# Patient Record
Sex: Male | Born: 1965 | Race: White | Hispanic: No | Marital: Married | Smoking: Former smoker
Health system: Southern US, Community
[De-identification: ages and names within clinical notes are randomized; demographics above are authoritative.]

## PROBLEM LIST (undated history)

## (undated) DIAGNOSIS — K5792 Diverticulitis of intestine, part unspecified, without perforation or abscess without bleeding: Secondary | ICD-10-CM

## (undated) DIAGNOSIS — M2550 Pain in unspecified joint: Secondary | ICD-10-CM

## (undated) DIAGNOSIS — I10 Essential (primary) hypertension: Secondary | ICD-10-CM

## (undated) DIAGNOSIS — R7303 Prediabetes: Secondary | ICD-10-CM

## (undated) DIAGNOSIS — F419 Anxiety disorder, unspecified: Secondary | ICD-10-CM

## (undated) HISTORY — DX: Essential (primary) hypertension: I10

## (undated) HISTORY — DX: Pain in unspecified joint: M25.50

## (undated) HISTORY — DX: Diverticulitis of intestine, part unspecified, without perforation or abscess without bleeding: K57.92

## (undated) HISTORY — DX: Anxiety disorder, unspecified: F41.9

## (undated) HISTORY — DX: Prediabetes: R73.03

---

## 1986-10-27 HISTORY — PX: NASAL SINUS SURGERY: SHX719

## 2004-12-30 ENCOUNTER — Ambulatory Visit: Payer: Self-pay | Admitting: Family Medicine

## 2014-03-09 ENCOUNTER — Telehealth: Payer: Self-pay

## 2014-03-09 NOTE — Telephone Encounter (Signed)
Patient confirmed

## 2014-03-10 ENCOUNTER — Ambulatory Visit (INDEPENDENT_AMBULATORY_CARE_PROVIDER_SITE_OTHER): Payer: BC Managed Care – PPO | Admitting: Internal Medicine

## 2014-03-10 ENCOUNTER — Encounter: Payer: Self-pay | Admitting: Internal Medicine

## 2014-03-10 VITALS — BP 132/82 | HR 67 | Temp 98.0°F | Ht 72.5 in | Wt 255.0 lb

## 2014-03-10 DIAGNOSIS — I10 Essential (primary) hypertension: Secondary | ICD-10-CM

## 2014-03-10 DIAGNOSIS — M2550 Pain in unspecified joint: Secondary | ICD-10-CM

## 2014-03-10 DIAGNOSIS — F411 Generalized anxiety disorder: Secondary | ICD-10-CM

## 2014-03-10 DIAGNOSIS — F419 Anxiety disorder, unspecified: Secondary | ICD-10-CM

## 2014-03-10 DIAGNOSIS — M1990 Unspecified osteoarthritis, unspecified site: Secondary | ICD-10-CM | POA: Insufficient documentation

## 2014-03-10 LAB — CBC WITH DIFFERENTIAL/PLATELET
Basophils Absolute: 0 10*3/uL (ref 0.0–0.1)
Basophils Relative: 0 % (ref 0–1)
EOS ABS: 0.1 10*3/uL (ref 0.0–0.7)
EOS PCT: 1 % (ref 0–5)
HEMATOCRIT: 43.6 % (ref 39.0–52.0)
HEMOGLOBIN: 16 g/dL (ref 13.0–17.0)
Lymphocytes Relative: 43 % (ref 12–46)
Lymphs Abs: 3.1 10*3/uL (ref 0.7–4.0)
MCH: 31.1 pg (ref 26.0–34.0)
MCHC: 36.7 g/dL — ABNORMAL HIGH (ref 30.0–36.0)
MCV: 84.7 fL (ref 78.0–100.0)
MONO ABS: 0.7 10*3/uL (ref 0.1–1.0)
MONOS PCT: 10 % (ref 3–12)
Neutro Abs: 3.4 10*3/uL (ref 1.7–7.7)
Neutrophils Relative %: 46 % (ref 43–77)
Platelets: 211 10*3/uL (ref 150–400)
RBC: 5.15 MIL/uL (ref 4.22–5.81)
RDW: 12.7 % (ref 11.5–15.5)
WBC: 7.3 10*3/uL (ref 4.0–10.5)

## 2014-03-10 LAB — COMPREHENSIVE METABOLIC PANEL
ALBUMIN: 4.6 g/dL (ref 3.5–5.2)
ALT: 15 U/L (ref 0–53)
AST: 18 U/L (ref 0–37)
Alkaline Phosphatase: 44 U/L (ref 39–117)
BILIRUBIN TOTAL: 0.5 mg/dL (ref 0.2–1.2)
BUN: 15 mg/dL (ref 6–23)
CO2: 25 mEq/L (ref 19–32)
CREATININE: 1.08 mg/dL (ref 0.50–1.35)
Calcium: 9.2 mg/dL (ref 8.4–10.5)
Chloride: 104 mEq/L (ref 96–112)
GLUCOSE: 97 mg/dL (ref 70–99)
Potassium: 4.4 mEq/L (ref 3.5–5.3)
Sodium: 140 mEq/L (ref 135–145)
Total Protein: 7.4 g/dL (ref 6.0–8.3)

## 2014-03-10 LAB — HEMOGLOBIN A1C
Hgb A1c MFr Bld: 6 % — ABNORMAL HIGH (ref <5.7)
MEAN PLASMA GLUCOSE: 126 mg/dL — AB (ref <117)

## 2014-03-10 MED ORDER — PAROXETINE HCL 20 MG PO TABS: 20.0000 mg | ORAL_TABLET | Freq: Every day | ORAL | Status: DC

## 2014-03-10 MED ORDER — LISINOPRIL 40 MG PO TABS: 40.0000 mg | ORAL_TABLET | Freq: Every day | ORAL | Status: DC

## 2014-03-10 NOTE — Assessment & Plan Note (Signed)
Long history of aches and pains, was taking relafen regularly, does not like to take the medication long-term. Recommend as needed Tylenol-motrin OTC Most important, I think he will benefit from a regular exercise and stretching program as well as lose weight. Offered a  physical therapy referral to learn appropriate stretching, he will let me know when he's ready

## 2014-03-10 NOTE — Progress Notes (Signed)
   Subjective:    Patient ID: Joel Hutchinson, male    DOB: Nov 24, 1965, 48 y.o.   MRN: [id number]  DOS:  03/10/2014 Type of  visit: New patient, to get  establish History of hypertension, good medication compliance, no ambulatory BPs however BPs are always wnl  whenever he goes to his physicians. History of panic attacks, on Paxil long-term, symptoms under excellent control History of arthralgias: Neck, back, elbows, shoulders. Has been on Relafen frequently, does not like to take it long term. Alternative meds?   ROS Denies chest pain, difficulty breathing. No nausea, vomiting, diarrhea. He  takes occasional walk but is otherwise not very active. Gained weight many years ago and has not been able to loose it (~55 pounds since he left high school).   Past Medical History  Diagnosis Date  . HTN (hypertension)   . Anxiety   . Arthralgia     Past Surgical History  Procedure Laterality Date  . Nasal sinus surgery  1988    History   Social History  . Marital Status: Married    Spouse Name: N/A    Number of Children: 0  . Years of Education: N/A   Occupational History  . truck driver    Social History Main Topics  . Smoking status: Former Research scientist (life sciences)  . Smokeless tobacco: Not on file     Comment: quit at age 58  . Alcohol Use: No  . Drug Use: No  . Sexual Activity: Not on file   Other Topics Concern  . Not on file   Social History Narrative  . No narrative on file     Family History  Problem Relation Age of Onset  . CAD Father     MI age 17, died few weeks later   . Diabetes Father     father, bro, GP  . Stroke Other     GM  . Atrial fibrillation Mother   . Colon cancer Neg Hx   . Prostate cancer Neg Hx        Medication List       This list is accurate as of: 03/10/14 11:59 PM.  Always use your most recent med list.               lisinopril 40 MG tablet  Commonly known as:  PRINIVIL,ZESTRIL  Take 1 tablet (40 mg total) by mouth daily.     PARoxetine 20 MG tablet  Commonly known as:  PAXIL  Take 1 tablet (20 mg total) by mouth daily.           Objective:   Physical Exam BP 132/82  Pulse 67  Temp(Src) 98 F (36.7 C)  Ht 6' 0.5" (1.842 m)  Wt 255 lb (115.667 kg)  BMI 34.09 kg/m2  SpO2 95%  General -- alert, well-developed, NAD.  Neck --no thyromegaly  HEENT-- Not pale.  Lungs -- normal respiratory effort, no intercostal retractions, no accessory muscle use, and normal breath sounds.  Heart-- normal rate, regular rhythm, no murmur.  Abdomen-- Not distended, good bowel sounds,soft, non-tender. Extremities-- no pretibial edema bilaterally ; hands -wrist-- no synovitis Neurologic--  alert & oriented X3. Speech normal, gait normal, strength normal in all extremities.  Psych-- Cognition and judgment appear intact. Cooperative with normal attention span and concentration. No anxious or depressed appearing.      Assessment & Plan:

## 2014-03-10 NOTE — Progress Notes (Signed)
Pre visit review using our clinic review tool, if applicable. No additional management support is needed unless otherwise documented below in the visit note. 

## 2014-03-10 NOTE — Patient Instructions (Signed)
Get your blood work before you leave   Next visit is for a physical exam in 6 months, fasting Please make an appointment     Get the records from your previous doctor

## 2014-03-10 NOTE — Assessment & Plan Note (Addendum)
Seems well controlled, continue with lisinopril, refill medications, labs including the A1c which the patient requested.

## 2014-03-10 NOTE — Assessment & Plan Note (Signed)
History of panic attacks, under excellent control for many years with Paxil. Refill Paxil today

## 2014-03-13 ENCOUNTER — Encounter: Payer: Self-pay | Admitting: *Deleted

## 2014-03-13 NOTE — Progress Notes (Signed)
Letter sent with labs results and MD notes

## 2014-03-14 ENCOUNTER — Telehealth: Payer: Self-pay | Admitting: *Deleted

## 2014-03-14 NOTE — Telephone Encounter (Signed)
Spoke with patient and advised of recent labs.  

## 2014-04-06 ENCOUNTER — Ambulatory Visit (INDEPENDENT_AMBULATORY_CARE_PROVIDER_SITE_OTHER): Payer: BC Managed Care – PPO | Admitting: Internal Medicine

## 2014-04-06 ENCOUNTER — Encounter: Payer: Self-pay | Admitting: Internal Medicine

## 2014-04-06 VITALS — BP 127/79 | HR 76 | Temp 98.0°F | Wt 242.0 lb

## 2014-04-06 DIAGNOSIS — R1013 Epigastric pain: Secondary | ICD-10-CM

## 2014-04-06 DIAGNOSIS — K3189 Other diseases of stomach and duodenum: Secondary | ICD-10-CM

## 2014-04-06 MED ORDER — OMEPRAZOLE 40 MG PO CPDR: 40.0000 mg | DELAYED_RELEASE_CAPSULE | Freq: Every day | ORAL | Status: DC

## 2014-04-06 NOTE — Progress Notes (Signed)
   Subjective:    Patient ID: Joel Hutchinson, male    DOB: 12-May-1966, 48 y.o.   MRN: [id number]  DOS:  04/06/2014 Type of  Visit: acute History: 6 days history of upper abdominal discomfort described as bloated, pressure; located @  the epigastric area as well as on the left and right upper abd. Described as dull and not burning. Taking Pepto-Bismol and tums but is not sure if that helped. Symptoms are usually 1 or 2 hours postprandial. No exertional symptoms. He takes Motrin 5 or 6 times a week, he is already holding that medication  ROS Denies fever or chills No chest pain, difficulty breathing or palpitations. No classic heartburn. No nausea, vomiting, diarrhea blood in the stools or change in the color of the stools.  Past Medical History  Diagnosis Date  . HTN (hypertension)   . Anxiety   . Arthralgia     Past Surgical History  Procedure Laterality Date  . Nasal sinus surgery  1988    History   Social History  . Marital Status: Married    Spouse Name: N/A    Number of Children: 0  . Years of Education: N/A   Occupational History  . truck driver    Social History Main Topics  . Smoking status: Former Research scientist (life sciences)  . Smokeless tobacco: Not on file     Comment: quit at age 63  . Alcohol Use: No  . Drug Use: No  . Sexual Activity: Not on file   Other Topics Concern  . Not on file   Social History Narrative  . No narrative on file        Medication List       This list is accurate as of: 04/06/14  2:43 PM.  Always use your most recent med list.               lisinopril 40 MG tablet  Commonly known as:  PRINIVIL,ZESTRIL  Take 1 tablet (40 mg total) by mouth daily.     omeprazole 40 MG capsule  Commonly known as:  PRILOSEC  Take 1 capsule (40 mg total) by mouth daily.     PARoxetine 20 MG tablet  Commonly known as:  PAXIL  Take 1 tablet (20 mg total) by mouth daily.           Objective:   Physical Exam BP 127/79  Pulse 76  Temp(Src) 98  F (36.7 C)  Wt 242 lb (109.77 kg)  SpO2 96% General -- alert, well-developed, NAD.   HEENT-- not pale  Lungs -- normal respiratory effort, no intercostal retractions, no accessory muscle use, and normal breath sounds.  Heart-- normal rate, regular rhythm, no murmur.  Abdomen-- Not distended, good bowel sounds,soft, Slightly tender at the epigastric area without mass or rebound..  No mass,organomegaly. Extremities-- no pretibial edema bilaterally  Neurologic--  alert & oriented X3. Speech normal, gait appropriate for age, strength symmetric and appropriate for age.  Psych-- Cognition and judgment appear intact. Cooperative with normal attention span and concentration. No anxious or depressed appearing.     Assessment & Plan:   Dyspepsia Upper abdominal discomfort for few days, no red flag symptoms. He recently started to take Motrin and has mild tenderness on  the epigastric area. No chest pain. Gastritis?. Plan: Omeprazole If he is not better or has more symptoms or different symptoms he will let me know. Consider a gallbladder ultrasound or further eval

## 2014-04-06 NOTE — Progress Notes (Signed)
Pre visit review using our clinic review tool, if applicable. No additional management support is needed unless otherwise documented below in the visit note. 

## 2014-04-06 NOTE — Patient Instructions (Signed)
Take omeprazole 1 tablet before breakfast for one month and then as needed Avoid Motrin for now  Call if you're not improving soon or  if you have increased symptoms, change in the color of the stools, chest pain.  Next visit for a checkup in November 2015

## 2014-04-09 ENCOUNTER — Observation Stay (HOSPITAL_COMMUNITY)
Admission: EM | Admit: 2014-04-09 | Discharge: 2014-04-10 | Disposition: A | Discharge status: Home / Self Care | Payer: BC Managed Care – PPO | Attending: General Surgery | Admitting: General Surgery

## 2014-04-09 ENCOUNTER — Observation Stay (HOSPITAL_COMMUNITY): Payer: BC Managed Care – PPO | Admitting: Anesthesiology

## 2014-04-09 ENCOUNTER — Encounter (HOSPITAL_COMMUNITY): Payer: BC Managed Care – PPO | Admitting: Anesthesiology

## 2014-04-09 ENCOUNTER — Emergency Department (HOSPITAL_COMMUNITY): Payer: BC Managed Care – PPO

## 2014-04-09 ENCOUNTER — Encounter (HOSPITAL_COMMUNITY): Payer: Self-pay | Admitting: Emergency Medicine

## 2014-04-09 ENCOUNTER — Encounter (HOSPITAL_COMMUNITY)
Admission: EM | Disposition: A | Discharge status: Home / Self Care | Payer: Self-pay | Source: Home / Self Care | Attending: Emergency Medicine

## 2014-04-09 DIAGNOSIS — R1011 Right upper quadrant pain: Secondary | ICD-10-CM

## 2014-04-09 DIAGNOSIS — F411 Generalized anxiety disorder: Secondary | ICD-10-CM | POA: Insufficient documentation

## 2014-04-09 DIAGNOSIS — K811 Chronic cholecystitis: Secondary | ICD-10-CM

## 2014-04-09 DIAGNOSIS — K8020 Calculus of gallbladder without cholecystitis without obstruction: Secondary | ICD-10-CM

## 2014-04-09 DIAGNOSIS — I10 Essential (primary) hypertension: Secondary | ICD-10-CM | POA: Insufficient documentation

## 2014-04-09 DIAGNOSIS — K810 Acute cholecystitis: Principal | ICD-10-CM | POA: Diagnosis present

## 2014-04-09 DIAGNOSIS — Z87891 Personal history of nicotine dependence: Secondary | ICD-10-CM | POA: Insufficient documentation

## 2014-04-09 DIAGNOSIS — M2550 Pain in unspecified joint: Secondary | ICD-10-CM | POA: Insufficient documentation

## 2014-04-09 HISTORY — PX: CHOLECYSTECTOMY: SHX55

## 2014-04-09 LAB — COMPREHENSIVE METABOLIC PANEL
ALT: 15 U/L (ref 0–53)
AST: 16 U/L (ref 0–37)
Albumin: 3.9 g/dL (ref 3.5–5.2)
Alkaline Phosphatase: 48 U/L (ref 39–117)
BILIRUBIN TOTAL: 0.3 mg/dL (ref 0.3–1.2)
BUN: 17 mg/dL (ref 6–23)
CO2: 26 meq/L (ref 19–32)
CREATININE: 1.01 mg/dL (ref 0.50–1.35)
Calcium: 9.3 mg/dL (ref 8.4–10.5)
Chloride: 98 mEq/L (ref 96–112)
GFR calc non Af Amer: 86 mL/min — ABNORMAL LOW (ref >90)
GLUCOSE: 165 mg/dL — AB (ref 70–99)
Potassium: 4.4 mEq/L (ref 3.7–5.3)
Sodium: 138 mEq/L (ref 137–147)
Total Protein: 7.6 g/dL (ref 6.0–8.3)

## 2014-04-09 LAB — CBC WITH DIFFERENTIAL/PLATELET
BASOS ABS: 0 10*3/uL (ref 0.0–0.1)
Basophils Relative: 0 % (ref 0–1)
EOS PCT: 0 % (ref 0–5)
Eosinophils Absolute: 0 10*3/uL (ref 0.0–0.7)
HEMATOCRIT: 40.4 % (ref 39.0–52.0)
HEMOGLOBIN: 14.2 g/dL (ref 13.0–17.0)
LYMPHS PCT: 10 % — AB (ref 12–46)
Lymphs Abs: 1.5 10*3/uL (ref 0.7–4.0)
MCH: 30.7 pg (ref 26.0–34.0)
MCHC: 35.1 g/dL (ref 30.0–36.0)
MCV: 87.3 fL (ref 78.0–100.0)
MONO ABS: 0.8 10*3/uL (ref 0.1–1.0)
MONOS PCT: 5 % (ref 3–12)
Neutro Abs: 12.5 10*3/uL — ABNORMAL HIGH (ref 1.7–7.7)
Neutrophils Relative %: 85 % — ABNORMAL HIGH (ref 43–77)
Platelets: 225 10*3/uL (ref 150–400)
RBC: 4.63 MIL/uL (ref 4.22–5.81)
RDW: 11.8 % (ref 11.5–15.5)
WBC: 14.8 10*3/uL — ABNORMAL HIGH (ref 4.0–10.5)

## 2014-04-09 LAB — CREATININE, SERUM
Creatinine, Ser: 0.87 mg/dL (ref 0.50–1.35)
GFR calc Af Amer: >90 mL/min (ref >90)
GFR calc non Af Amer: >90 mL/min (ref >90)

## 2014-04-09 LAB — CBC
HCT: 39.2 % (ref 39.0–52.0)
HEMOGLOBIN: 13.9 g/dL (ref 13.0–17.0)
MCH: 31 pg (ref 26.0–34.0)
MCHC: 35.5 g/dL (ref 30.0–36.0)
MCV: 87.5 fL (ref 78.0–100.0)
PLATELETS: 197 10*3/uL (ref 150–400)
RBC: 4.48 MIL/uL (ref 4.22–5.81)
RDW: 11.9 % (ref 11.5–15.5)
WBC: 12.2 10*3/uL — ABNORMAL HIGH (ref 4.0–10.5)

## 2014-04-09 LAB — LIPASE, BLOOD: Lipase: 51 U/L (ref 11–59)

## 2014-04-09 SURGERY — LAPAROSCOPIC CHOLECYSTECTOMY WITH INTRAOPERATIVE CHOLANGIOGRAM
Anesthesia: General

## 2014-04-09 MED ORDER — ROCURONIUM BROMIDE 100 MG/10ML IV SOLN
INTRAVENOUS | Status: DC | PRN
  Administered 2014-04-09: 10 mg via INTRAVENOUS
  Administered 2014-04-09: 5 mg via INTRAVENOUS
  Administered 2014-04-09: 50 mg via INTRAVENOUS

## 2014-04-09 MED ORDER — FENTANYL CITRATE 0.05 MG/ML IJ SOLN
INTRAMUSCULAR | Status: AC
  Filled 2014-04-09: qty 5

## 2014-04-09 MED ORDER — SODIUM CHLORIDE 0.9 % IV SOLN
Freq: Once | INTRAVENOUS | Status: AC
  Administered 2014-04-09: 06:00:00 via INTRAVENOUS

## 2014-04-09 MED ORDER — GLYCOPYRROLATE 0.2 MG/ML IJ SOLN
INTRAMUSCULAR | Status: DC | PRN
  Administered 2014-04-09: 0.4 mg via INTRAVENOUS
  Administered 2014-04-09: 0.2 mg via INTRAVENOUS

## 2014-04-09 MED ORDER — ACETAMINOPHEN 325 MG PO TABS
650.0000 mg | ORAL_TABLET | Freq: Four times a day (QID) | ORAL | Status: DC | PRN
  Administered 2014-04-09 (×2): 650 mg via ORAL
  Filled 2014-04-09 (×2): qty 2

## 2014-04-09 MED ORDER — KCL IN DEXTROSE-NACL 20-5-0.9 MEQ/L-%-% IV SOLN
INTRAVENOUS | Status: DC
  Administered 2014-04-09 (×2): via INTRAVENOUS
  Filled 2014-04-09 (×5): qty 1000

## 2014-04-09 MED ORDER — OXYCODONE HCL 5 MG PO TABS: 10.0000 mg | ORAL_TABLET | ORAL | Status: DC | PRN

## 2014-04-09 MED ORDER — NEOSTIGMINE METHYLSULFATE 10 MG/10ML IV SOLN
INTRAVENOUS | Status: AC
  Filled 2014-04-09: qty 1

## 2014-04-09 MED ORDER — ROCURONIUM BROMIDE 50 MG/5ML IV SOLN
INTRAVENOUS | Status: AC
  Filled 2014-04-09: qty 1

## 2014-04-09 MED ORDER — ONDANSETRON HCL 4 MG/2ML IJ SOLN: 4.0000 mg | Freq: Four times a day (QID) | INTRAMUSCULAR | Status: DC | PRN

## 2014-04-09 MED ORDER — MIDAZOLAM HCL 5 MG/5ML IJ SOLN
INTRAMUSCULAR | Status: DC | PRN
  Administered 2014-04-09: 2 mg via INTRAVENOUS

## 2014-04-09 MED ORDER — ATROPINE SULFATE 0.1 MG/ML IJ SOLN
INTRAMUSCULAR | Status: AC
  Filled 2014-04-09: qty 10

## 2014-04-09 MED ORDER — HYDROMORPHONE HCL PF 1 MG/ML IJ SOLN: 0.2500 mg | INTRAMUSCULAR | Status: DC | PRN

## 2014-04-09 MED ORDER — BUPIVACAINE-EPINEPHRINE 0.25% -1:200000 IJ SOLN
INTRAMUSCULAR | Status: DC | PRN
  Administered 2014-04-09: 10 mL

## 2014-04-09 MED ORDER — EPHEDRINE SULFATE 50 MG/ML IJ SOLN
INTRAMUSCULAR | Status: AC
  Filled 2014-04-09: qty 1

## 2014-04-09 MED ORDER — FENTANYL CITRATE 0.05 MG/ML IJ SOLN
INTRAMUSCULAR | Status: DC | PRN
  Administered 2014-04-09 (×3): 50 ug via INTRAVENOUS
  Administered 2014-04-09: 100 ug via INTRAVENOUS
  Administered 2014-04-09 (×2): 50 ug via INTRAVENOUS

## 2014-04-09 MED ORDER — SODIUM CHLORIDE 0.9 % IJ SOLN
INTRAMUSCULAR | Status: AC
  Filled 2014-04-09: qty 10

## 2014-04-09 MED ORDER — CIPROFLOXACIN IN D5W 400 MG/200ML IV SOLN
400.0000 mg | Freq: Two times a day (BID) | INTRAVENOUS | Status: DC
  Administered 2014-04-09 – 2014-04-10 (×3): 400 mg via INTRAVENOUS
  Filled 2014-04-09 (×4): qty 200

## 2014-04-09 MED ORDER — PHENYLEPHRINE 40 MCG/ML (10ML) SYRINGE FOR IV PUSH (FOR BLOOD PRESSURE SUPPORT)
PREFILLED_SYRINGE | INTRAVENOUS | Status: AC
  Filled 2014-04-09: qty 10

## 2014-04-09 MED ORDER — ONDANSETRON HCL 4 MG/2ML IJ SOLN: 4.0000 mg | Freq: Once | INTRAMUSCULAR | Status: DC | PRN

## 2014-04-09 MED ORDER — 0.9 % SODIUM CHLORIDE (POUR BTL) OPTIME
TOPICAL | Status: DC | PRN
  Administered 2014-04-09: 1000 mL

## 2014-04-09 MED ORDER — SUCCINYLCHOLINE CHLORIDE 20 MG/ML IJ SOLN
INTRAMUSCULAR | Status: AC
  Filled 2014-04-09: qty 1

## 2014-04-09 MED ORDER — ENOXAPARIN SODIUM 40 MG/0.4ML ~~LOC~~ SOLN
40.0000 mg | Freq: Every day | SUBCUTANEOUS | Status: DC
  Administered 2014-04-10: 40 mg via SUBCUTANEOUS
  Filled 2014-04-09: qty 0.4

## 2014-04-09 MED ORDER — PHENYLEPHRINE HCL 10 MG/ML IJ SOLN
INTRAMUSCULAR | Status: DC | PRN
  Administered 2014-04-09: 80 ug via INTRAVENOUS

## 2014-04-09 MED ORDER — HYDROMORPHONE HCL PF 1 MG/ML IJ SOLN: 1.0000 mg | INTRAMUSCULAR | Status: DC | PRN

## 2014-04-09 MED ORDER — PAROXETINE HCL 20 MG PO TABS
20.0000 mg | ORAL_TABLET | Freq: Every day | ORAL | Status: DC
  Administered 2014-04-09: 20 mg via ORAL
  Filled 2014-04-09 (×2): qty 1

## 2014-04-09 MED ORDER — LISINOPRIL 40 MG PO TABS
40.0000 mg | ORAL_TABLET | Freq: Every day | ORAL | Status: DC
  Administered 2014-04-09: 40 mg via ORAL
  Filled 2014-04-09 (×2): qty 1
  Filled 2014-04-09 (×2): qty 2

## 2014-04-09 MED ORDER — LIDOCAINE HCL (CARDIAC) 20 MG/ML IV SOLN
INTRAVENOUS | Status: AC
  Filled 2014-04-09: qty 5

## 2014-04-09 MED ORDER — OXYCODONE HCL 5 MG/5ML PO SOLN
5.0000 mg | Freq: Once | ORAL | Status: DC | PRN
Start: 2014-04-09 — End: 2014-04-09

## 2014-04-09 MED ORDER — SODIUM CHLORIDE 0.9 % IR SOLN
Status: DC | PRN
  Administered 2014-04-09: 1000 mL

## 2014-04-09 MED ORDER — ATROPINE SULFATE 0.4 MG/ML IJ SOLN
INTRAMUSCULAR | Status: DC | PRN
  Administered 2014-04-09: .2 mg via INTRAVENOUS

## 2014-04-09 MED ORDER — NEOSTIGMINE METHYLSULFATE 10 MG/10ML IV SOLN
INTRAVENOUS | Status: DC | PRN
  Administered 2014-04-09: 3 mg via INTRAVENOUS

## 2014-04-09 MED ORDER — LACTATED RINGERS IV SOLN
INTRAVENOUS | Status: DC | PRN
  Administered 2014-04-09 (×2): via INTRAVENOUS

## 2014-04-09 MED ORDER — DEXAMETHASONE SODIUM PHOSPHATE 4 MG/ML IJ SOLN
INTRAMUSCULAR | Status: AC
  Filled 2014-04-09: qty 2

## 2014-04-09 MED ORDER — PANTOPRAZOLE SODIUM 40 MG PO TBEC
40.0000 mg | DELAYED_RELEASE_TABLET | Freq: Every day | ORAL | Status: DC
  Administered 2014-04-09 – 2014-04-10 (×2): 40 mg via ORAL
  Filled 2014-04-09 (×2): qty 1

## 2014-04-09 MED ORDER — PROPOFOL 10 MG/ML IV BOLUS
INTRAVENOUS | Status: DC | PRN
  Administered 2014-04-09: 200 mg via INTRAVENOUS

## 2014-04-09 MED ORDER — LIDOCAINE HCL (CARDIAC) 20 MG/ML IV SOLN
INTRAVENOUS | Status: DC | PRN
  Administered 2014-04-09: 80 mg via INTRAVENOUS

## 2014-04-09 MED ORDER — MIDAZOLAM HCL 2 MG/2ML IJ SOLN
INTRAMUSCULAR | Status: AC
  Filled 2014-04-09: qty 2

## 2014-04-09 MED ORDER — MORPHINE SULFATE 4 MG/ML IJ SOLN
4.0000 mg | Freq: Once | INTRAMUSCULAR | Status: AC
  Administered 2014-04-09: 4 mg via INTRAVENOUS
  Filled 2014-04-09: qty 1

## 2014-04-09 MED ORDER — GLYCOPYRROLATE 0.2 MG/ML IJ SOLN
INTRAMUSCULAR | Status: AC
  Filled 2014-04-09: qty 2

## 2014-04-09 MED ORDER — OXYCODONE HCL 5 MG PO TABS: 5.0000 mg | ORAL_TABLET | Freq: Once | ORAL | Status: DC | PRN

## 2014-04-09 MED ORDER — DEXAMETHASONE SODIUM PHOSPHATE 4 MG/ML IJ SOLN
INTRAMUSCULAR | Status: DC | PRN
  Administered 2014-04-09: 10 mg via INTRAVENOUS

## 2014-04-09 MED ORDER — ONDANSETRON HCL 4 MG/2ML IJ SOLN
INTRAMUSCULAR | Status: AC
  Filled 2014-04-09: qty 2

## 2014-04-09 MED ORDER — ONDANSETRON HCL 4 MG/2ML IJ SOLN
4.0000 mg | Freq: Once | INTRAMUSCULAR | Status: AC
  Administered 2014-04-09: 4 mg via INTRAVENOUS
  Filled 2014-04-09: qty 2

## 2014-04-09 MED ORDER — SODIUM CHLORIDE 0.9 % IV SOLN
Freq: Once | INTRAVENOUS | Status: AC
Start: 2014-04-09 — End: 2014-04-09
  Administered 2014-04-09: 04:00:00 via INTRAVENOUS

## 2014-04-09 MED ORDER — SCOPOLAMINE 1 MG/3DAYS TD PT72
MEDICATED_PATCH | TRANSDERMAL | Status: AC
  Administered 2014-04-09: 1 via TRANSDERMAL
  Filled 2014-04-09: qty 1

## 2014-04-09 MED ORDER — BUPIVACAINE HCL (PF) 0.25 % IJ SOLN
INTRAMUSCULAR | Status: AC
  Filled 2014-04-09: qty 30

## 2014-04-09 MED ORDER — PROPOFOL 10 MG/ML IV BOLUS
INTRAVENOUS | Status: AC
  Filled 2014-04-09: qty 20

## 2014-04-09 MED ORDER — ONDANSETRON HCL 4 MG/2ML IJ SOLN
INTRAMUSCULAR | Status: DC | PRN
  Administered 2014-04-09: 4 mg via INTRAVENOUS

## 2014-04-09 SURGICAL SUPPLY — 38 items
ADH SKN CLS APL DERMABOND .7 (GAUZE/BANDAGES/DRESSINGS) ×1
APPLIER CLIP 5 13 M/L LIGAMAX5 (MISCELLANEOUS) ×3
APR CLP MED LRG 5 ANG JAW (MISCELLANEOUS) ×1
BAG SPEC RTRVL 10 TROC 200 (ENDOMECHANICALS) ×2
BLADE SURG ROTATE 9660 (MISCELLANEOUS) IMPLANT
CANISTER SUCTION 2500CC (MISCELLANEOUS) ×3 IMPLANT
CHLORAPREP W/TINT 26ML (MISCELLANEOUS) ×3 IMPLANT
CLIP APPLIE 5 13 M/L LIGAMAX5 (MISCELLANEOUS) ×1 IMPLANT
COVER MAYO STAND STRL (DRAPES) ×3 IMPLANT
COVER SURGICAL LIGHT HANDLE (MISCELLANEOUS) ×3 IMPLANT
DERMABOND ADVANCED (GAUZE/BANDAGES/DRESSINGS) ×2
DERMABOND ADVANCED .7 DNX12 (GAUZE/BANDAGES/DRESSINGS) ×1 IMPLANT
DRAPE C-ARM 42X72 X-RAY (DRAPES) ×3 IMPLANT
ELECT REM PT RETURN 9FT ADLT (ELECTROSURGICAL) ×3
ELECTRODE REM PT RTRN 9FT ADLT (ELECTROSURGICAL) ×1 IMPLANT
GLOVE BIO SURGEON STRL SZ7 (GLOVE) ×3 IMPLANT
GLOVE BIOGEL PI IND STRL 7.5 (GLOVE) ×1 IMPLANT
GLOVE BIOGEL PI INDICATOR 7.5 (GLOVE) ×2
GOWN STRL REUS W/ TWL LRG LVL3 (GOWN DISPOSABLE) ×4 IMPLANT
GOWN STRL REUS W/TWL LRG LVL3 (GOWN DISPOSABLE) ×12
HEMOSTAT SNOW SURGICEL 2X4 (HEMOSTASIS) ×2 IMPLANT
KIT BASIN OR (CUSTOM PROCEDURE TRAY) ×3 IMPLANT
KIT ROOM TURNOVER OR (KITS) ×3 IMPLANT
NS IRRIG 1000ML POUR BTL (IV SOLUTION) ×3 IMPLANT
PAD ARMBOARD 7.5X6 YLW CONV (MISCELLANEOUS) ×3 IMPLANT
POUCH RETRIEVAL ECOSAC 10 (ENDOMECHANICALS) ×1 IMPLANT
POUCH RETRIEVAL ECOSAC 10MM (ENDOMECHANICALS) ×4
SCISSORS LAP 5X35 DISP (ENDOMECHANICALS) ×3 IMPLANT
SET CHOLANGIOGRAPH 5 50 .035 (SET/KITS/TRAYS/PACK) ×3 IMPLANT
SET IRRIG TUBING LAPAROSCOPIC (IRRIGATION / IRRIGATOR) ×3 IMPLANT
SLEEVE ENDOPATH XCEL 5M (ENDOMECHANICALS) ×6 IMPLANT
SPECIMEN JAR SMALL (MISCELLANEOUS) ×3 IMPLANT
SUT MNCRL AB 4-0 PS2 18 (SUTURE) ×3 IMPLANT
TOWEL OR 17X24 6PK STRL BLUE (TOWEL DISPOSABLE) ×3 IMPLANT
TOWEL OR 17X26 10 PK STRL BLUE (TOWEL DISPOSABLE) ×3 IMPLANT
TRAY LAPAROSCOPIC (CUSTOM PROCEDURE TRAY) ×3 IMPLANT
TROCAR XCEL BLUNT TIP 100MML (ENDOMECHANICALS) ×3 IMPLANT
TROCAR XCEL NON-BLD 5MMX100MML (ENDOMECHANICALS) ×3 IMPLANT

## 2014-04-09 NOTE — Progress Notes (Signed)
Patient ID: Joel Hutchinson, male   DOB: 1966-10-17, 48 y.o.   MRN: [id number] I have seen patient, examined him, reviewed labs and studies. He has acute cholecystitis. I discussed the procedure in detail.    We discussed the risks and benefits of a laparoscopic cholecystectomy and possible cholangiogram including, but not limited to bleeding, infection, injury to surrounding structures such as the intestine or liver, bile leak, retained gallstones, need to convert to an open procedure, prolonged diarrhea, blood clots such as  DVT, common bile duct injury, anesthesia risks, and possible need for additional procedures.  The likelihood of improvement in symptoms and return to the patient's normal status is good. We discussed the typical post-operative recovery course.

## 2014-04-09 NOTE — Op Note (Addendum)
a 

## 2014-04-09 NOTE — ED Notes (Signed)
Attempted to call report x1, Dub Mikes RN will call back shortly

## 2014-04-09 NOTE — Anesthesia Preprocedure Evaluation (Addendum)
Anesthesia Evaluation  Patient identified by MRN, date of birth, ID band Patient awake    Reviewed: Allergy & Precautions, H&P , NPO status , Patient's Chart, lab work & pertinent test results  History of Anesthesia Complications (+) PONV  Airway Mallampati: I TM Distance: >3 FB Neck ROM: Full    Dental  (+) Teeth Intact, Dental Advisory Given   Pulmonary former smoker,  breath sounds clear to auscultation        Cardiovascular hypertension, Pt. on medications Rhythm:Regular Rate:Normal     Neuro/Psych    GI/Hepatic   Endo/Other    Renal/GU      Musculoskeletal   Abdominal   Peds  Hematology   Anesthesia Other Findings   Reproductive/Obstetrics                          Anesthesia Physical Anesthesia Plan  ASA: II  Anesthesia Plan: General   Post-op Pain Management:    Induction: Intravenous  Airway Management Planned: Oral ETT  Additional Equipment:   Intra-op Plan:   Post-operative Plan: Extubation in OR  Informed Consent: I have reviewed the patients History and Physical, chart, labs and discussed the procedure including the risks, benefits and alternatives for the proposed anesthesia with the patient or authorized representative who has indicated his/her understanding and acceptance.   Dental advisory given  Plan Discussed with: CRNA, Anesthesiologist and Surgeon  Anesthesia Plan Comments:         Anesthesia Quick Evaluation

## 2014-04-09 NOTE — Anesthesia Procedure Notes (Addendum)
Procedure Name: Intubation Date/Time: 04/09/2014 8:41 AM Performed by: Julian Reil Pre-anesthesia Checklist: Patient identified, Emergency Drugs available, Suction available and Patient being monitored Patient Re-evaluated:Patient Re-evaluated prior to inductionOxygen Delivery Method: Circle system utilized Preoxygenation: Pre-oxygenation with 100% oxygen Intubation Type: IV induction Ventilation: Mask ventilation without difficulty and Oral airway inserted - appropriate to patient size Laryngoscope Size: Mac and 4 Grade View: Grade I Tube type: Oral Tube size: 7.5 mm Number of attempts: 1 Airway Equipment and Method: Stylet Placement Confirmation: ETT inserted through vocal cords under direct vision,  positive ETCO2 and breath sounds checked- equal and bilateral Secured at: 21 cm Tube secured with: Tape Dental Injury: Teeth and Oropharynx as per pre-operative assessment

## 2014-04-09 NOTE — Anesthesia Postprocedure Evaluation (Signed)
  Anesthesia Post-op Note  Patient: Joel Hutchinson  Procedure(s) Performed: Procedure(s): LAPAROSCOPIC CHOLECYSTECTOMY (N/A)  Patient Location: PACU  Anesthesia Type:General  Level of Consciousness: awake, alert  and oriented  Airway and Oxygen Therapy: Patient Spontanous Breathing and Patient connected to nasal cannula oxygen  Post-op Pain: none  Post-op Assessment: Post-op Vital signs reviewed  Post-op Vital Signs: Reviewed  Last Vitals:  Filed Vitals:   04/09/14 1013  BP: 108/51  Pulse: 86  Temp: 36.8 C  Resp: 22    Complications: No apparent anesthesia complications

## 2014-04-09 NOTE — ED Provider Notes (Signed)
CSN: [id number]     Arrival date & time 04/09/14  0107 History   First MD Initiated Contact with Patient 04/09/14 0300     Chief Complaint  Patient presents with  . Abdominal Pain     (Consider location/radiation/quality/duration/timing/severity/associated sxs/prior Treatment) HPI 48 year old male presents to emergency room with complaint of abdominal pain, nausea.  Patient reports a week ago Friday he had to 3 hours of upper abdominal pain associated with bloating and nausea.  Symptoms occurred again Wednesday evening as well.  On Thursday, he was seen by his primary care Dr. Larose Kells.  He was started on Prilosec for gastritis.  Tonight had another episode of worsening sharp pain in his right upper quadrant with radiation across his abdomen.  No fevers or chills.  No diarrhea.  Patient recently diagnosed as prediabetes, and has been dieting.  Last nights meal was a break from the dieting. Past Medical History  Diagnosis Date  . HTN (hypertension)   . Anxiety   . Arthralgia    Past Surgical History  Procedure Laterality Date  . Nasal sinus surgery  1988   Family History  Problem Relation Age of Onset  . CAD Father     MI age 42, died few weeks later   . Diabetes Father     father, bro, GP  . Stroke Other     GM  . Atrial fibrillation Mother   . Colon cancer Neg Hx   . Prostate cancer Neg Hx    History  Substance Use Topics  . Smoking status: Former Research scientist (life sciences)  . Smokeless tobacco: Not on file     Comment: quit at age 58  . Alcohol Use: No    Review of Systems  See History of Present Illness; otherwise all other systems are reviewed and negative   Allergies  Review of patient's allergies indicates no known allergies.  Home Medications   Prior to Admission medications   Medication Sig Start Date End Date Taking? Authorizing Provider  acetaminophen (TYLENOL) 500 MG tablet Take 1,000 mg by mouth every 6 (six) hours as needed for mild pain.   Yes Historical Provider, MD   lisinopril (PRINIVIL,ZESTRIL) 40 MG tablet Take 1 tablet (40 mg total) by mouth daily. 03/10/14  Yes Colon Branch, MD  omeprazole (PRILOSEC) 40 MG capsule Take 1 capsule (40 mg total) by mouth daily. 04/06/14  Yes Colon Branch, MD  PARoxetine (PAXIL) 20 MG tablet Take 1 tablet (20 mg total) by mouth daily. 03/10/14  Yes Colon Branch, MD   BP 127/76  Pulse 47  Temp(Src) 98.3 F (36.8 C) (Oral)  Resp 12  Ht 6' (1.829 m)  Wt 242 lb (109.77 kg)  BMI 32.81 kg/m2  SpO2 99% Physical Exam  Nursing note and vitals reviewed. Constitutional: He is oriented to person, place, and time. He appears well-developed and well-nourished. He appears distressed (uncomfortable appearing).  HENT:  Head: Normocephalic and atraumatic.  Nose: Nose normal.  Mouth/Throat: Oropharynx is clear and moist.  Eyes: Conjunctivae and EOM are normal. Pupils are equal, round, and reactive to light.  Neck: Normal range of motion. Neck supple. No JVD present. No tracheal deviation present. No thyromegaly present.  Cardiovascular: Normal rate, regular rhythm, normal heart sounds and intact distal pulses.  Exam reveals no gallop and no friction rub.   No murmur heard. Pulmonary/Chest: Effort normal and breath sounds normal. No stridor. No respiratory distress. He has no wheezes. He has no rales. He exhibits no tenderness.  Abdominal: Soft. Bowel sounds are normal. He exhibits no distension and no mass. There is tenderness (patient has significant tenderness in right upper quadrant with positive Murphy sign.). There is no rebound and no guarding.  Musculoskeletal: Normal range of motion. He exhibits no edema and no tenderness.  Lymphadenopathy:    He has no cervical adenopathy.  Neurological: He is alert and oriented to person, place, and time. He exhibits normal muscle tone. Coordination normal.  Skin: Skin is warm and dry. No rash noted. No erythema. No pallor.  Psychiatric: He has a normal mood and affect. His behavior is normal.  Judgment and thought content normal.    ED Course  Procedures (including critical care time) Labs Review Labs Reviewed  CBC WITH DIFFERENTIAL - Abnormal; Notable for the following:    WBC 14.8 (*)    Neutrophils Relative % 85 (*)    Neutro Abs 12.5 (*)    Lymphocytes Relative 10 (*)    All other components within normal limits  COMPREHENSIVE METABOLIC PANEL - Abnormal; Notable for the following:    Glucose, Bld 165 (*)    GFR calc non Af Amer 86 (*)    All other components within normal limits  LIPASE, BLOOD    Imaging Review US Abdomen Limited  04/09/2014   CLINICAL DATA:  Right upper quadrant abdominal pain.  EXAM: US ABDOMEN LIMITED - RIGHT UPPER QUADRANT  COMPARISON:  None.  FINDINGS: Gallbladder:  Sludge and mobile stones are seen within the gallbladder, with stones measuring up to 0.6 cm in size. There is diffuse gallbladder wall thickening to 0.6 cm, with a positive ultrasonographic Murphy's sign, concerning for acute cholecystitis.  Common bile duct:  Diameter: 0.4 cm, within normal limits in caliber.  Liver:  No focal lesion identified. Mildly heterogeneous parenchymal echogenicity, without definite fatty infiltration.  IMPRESSION: Diffuse gallbladder wall thickening, with a positive ultrasonographic Murphy's sign, concerning for acute cholecystitis. Underlying sludge and mobile stones seen within the gallbladder. No evidence for obstruction.   Electronically Signed   By: Garald Balding M.D.   On: 04/09/2014 04:29     EKG Interpretation   Date/Time:  Sunday April 09 2014 02:06:01 EDT Ventricular Rate:  55 PR Interval:  134 QRS Duration: 152 QT Interval:  433 QTC Calculation: 414 R Axis:   84 Text Interpretation:  Sinus arrhythmia Right bundle branch block No old  tracing to compare Confirmed by Zaden Sako  MD, Aboubacar Matsuo ([id number]) on 04/09/2014  3:23:05 AM      MDM   Final diagnoses:  Acute cholecystitis    48 year old male with right upper quadrant pain, elevated white blood  cell count and symptoms that seem consistent with biliary colic.  Chemistries are pending, plan for right upper quadrant ultrasound.  Will give pain and nausea medicine.  Ultrasound shows cholelithiasis and possible cholecystitis with gallbladder wall thickening.  Case discussed with Dr. Brantley Stage, and they will consult in the ED  Kalman Drape, MD 04/09/14 [phone]

## 2014-04-09 NOTE — ED Notes (Addendum)
Pt arrives with c/o RUQ abd pain rated 8/10, was seen at primary care within the last week and was dxed with gastrinitis according to pt's wife. Pt has been using generic previcid, ineffective. Pain worse today, nauseated, denies V/D. Feels lightheaded.

## 2014-04-09 NOTE — Op Note (Signed)
Preoperative diagnosis: acute cholecystitis Postoperative diagnosis: same as above  Procedure: laparoscopic cholecystectomy  Surgeon: Dr Serita Grammes  Anesthesia: Gen.  Specimens: Gallbladder and contents to pathology  Drains: none  Estimated blood loss: Minimal  Complications none  Sponge count correct at completion  Disposition to recovery stable   Indications: This 26 yom admitted with acute cholecystitis.  We discussed laparoscopic cholecystectomy and risks/benefits associated with it.   Procedure: After informed consent was obtained the patient was taken to the operating room. He was given antibiotics preoperatively. Sequential compression devices were on his legs. He was placed under general anesthesia without complication. His abdomen was prepped and draped in the standard sterile surgical fashion. Surgical timeout was performed.  I infiltrated Marcaine below his umbilicus. I made a vertical incision. I incised his fascia and entered into the peritoneum bluntly. I placed a 0 Vicryl pursestring suture to the fascia. I inserted a Hassan trocar and insufflated the abdomen to 15 mm mercury pressure. I then inserted 3 further 5 mm trocars in the epigastrium and right upper quadrant under direct vision without complication. The gallbladder was retracted cephalad and lateral. The gallbladder was acutely inflamed with a very thick wall. I had to aspirate it to be able to grasp it. Eventually I was able to obtain the critical view of safety. I clipped the artery and divided it. I treated a posterior branch in a similar fashion. The duct was then clipped with the clips traversing the entire duct. The duct was divided. He had a very short cystic duct. The duct was viable there as well. . I then removed the gallbladder from the liver bed. I was able to place the gallbladder in a bag and remove it. I obtained hemostasis. I did enter the gallbladder and the liver while removing it due to the inflammation.   I placed a piece of surgicel snow in the bed. I then removed all my trocars. I tied down my umbilical stitch and completely obliterated the defect. I did place an additional 0 vicryl stitch in the umbilicus. Then I closed using 4-0 Monocryl and Dermabond. He tolerated this well was extubated and transferred to recovery stable.

## 2014-04-09 NOTE — Transfer of Care (Signed)
Immediate Anesthesia Transfer of Care Note  Patient: Joel Hutchinson  Procedure(s) Performed: Procedure(s): LAPAROSCOPIC CHOLECYSTECTOMY (N/A)  Patient Location: PACU  Anesthesia Type:General  Level of Consciousness: awake, alert , oriented and patient cooperative  Airway & Oxygen Therapy: Patient Spontanous Breathing and Patient connected to face mask oxygen  Post-op Assessment: Report given to PACU RN, Post -op Vital signs reviewed and stable and Patient moving all extremities  Post vital signs: Reviewed and stable  Complications: No apparent anesthesia complications

## 2014-04-09 NOTE — H&P (Signed)
Joel Hutchinson is an 48 y.o. male.   Chief Complaint: abdominal pain HPI: pt presents with hx of RUQ abdominal pain.  On and off since last Friday. Occurs at bed time and has gone away over a few hours.  Last night it occurred around 11 and did not go away.  Sharp, constant ,  Severe with radiation to back.  Made better with pain meds but not gone all the way.  U/S shows gallstones and thickened GB wall. Pt has positive sonographic Murphy sign.   Past Medical History  Diagnosis Date  . HTN (hypertension)   . Anxiety   . Arthralgia     Past Surgical History  Procedure Laterality Date  . Nasal sinus surgery  1988    Family History  Problem Relation Age of Onset  . CAD Father     MI age 52, died few weeks later   . Diabetes Father     father, bro, GP  . Stroke Other     GM  . Atrial fibrillation Mother   . Colon cancer Neg Hx   . Prostate cancer Neg Hx    Social History:  reports that he has quit smoking. He does not have any smokeless tobacco history on file. He reports that he does not drink alcohol or use illicit drugs.  Allergies: No Known Allergies   (Not in a hospital admission)  Results for orders placed during the hospital encounter of 04/09/14 (from the past 48 hour(s))  CBC WITH DIFFERENTIAL     Status: Abnormal   Collection Time    04/09/14  3:04 AM      Result Value Ref Range   WBC 14.8 (*) 4.0 - 10.5 K/uL   RBC 4.63  4.22 - 5.81 MIL/uL   Hemoglobin 14.2  13.0 - 17.0 g/dL   HCT 40.4  39.0 - 52.0 %   MCV 87.3  78.0 - 100.0 fL   MCH 30.7  26.0 - 34.0 pg   MCHC 35.1  30.0 - 36.0 g/dL   RDW 11.8  11.5 - 15.5 %   Platelets 225  150 - 400 K/uL   Neutrophils Relative % 85 (*) 43 - 77 %   Neutro Abs 12.5 (*) 1.7 - 7.7 K/uL   Lymphocytes Relative 10 (*) 12 - 46 %   Lymphs Abs 1.5  0.7 - 4.0 K/uL   Monocytes Relative 5  3 - 12 %   Monocytes Absolute 0.8  0.1 - 1.0 K/uL   Eosinophils Relative 0  0 - 5 %   Eosinophils Absolute 0.0  0.0 - 0.7 K/uL   Basophils  Relative 0  0 - 1 %   Basophils Absolute 0.0  0.0 - 0.1 K/uL  COMPREHENSIVE METABOLIC PANEL     Status: Abnormal   Collection Time    04/09/14  3:04 AM      Result Value Ref Range   Sodium 138  137 - 147 mEq/L   Potassium 4.4  3.7 - 5.3 mEq/L   Chloride 98  96 - 112 mEq/L   CO2 26  19 - 32 mEq/L   Glucose, Bld 165 (*) 70 - 99 mg/dL   BUN 17  6 - 23 mg/dL   Creatinine, Ser 1.01  0.50 - 1.35 mg/dL   Calcium 9.3  8.4 - 10.5 mg/dL   Total Protein 7.6  6.0 - 8.3 g/dL   Albumin 3.9  3.5 - 5.2 g/dL   AST 16  0 - 37  U/L   ALT 15  0 - 53 U/L   Alkaline Phosphatase 48  39 - 117 U/L   Total Bilirubin 0.3  0.3 - 1.2 mg/dL   GFR calc non Af Amer 86 (*) >90 mL/min   GFR calc Af Amer >90  >90 mL/min   Comment: (NOTE)     The eGFR has been calculated using the CKD EPI equation.     This calculation has not been validated in all clinical situations.     eGFR's persistently <90 mL/min signify possible Chronic Kidney     Disease.  LIPASE, BLOOD     Status: None   Collection Time    04/09/14  3:04 AM      Result Value Ref Range   Lipase 51  11 - 59 U/L   US Abdomen Limited  04/09/2014   CLINICAL DATA:  Right upper quadrant abdominal pain.  EXAM: US ABDOMEN LIMITED - RIGHT UPPER QUADRANT  COMPARISON:  None.  FINDINGS: Gallbladder:  Sludge and mobile stones are seen within the gallbladder, with stones measuring up to 0.6 cm in size. There is diffuse gallbladder wall thickening to 0.6 cm, with a positive ultrasonographic Murphy's sign, concerning for acute cholecystitis.  Common bile duct:  Diameter: 0.4 cm, within normal limits in caliber.  Liver:  No focal lesion identified. Mildly heterogeneous parenchymal echogenicity, without definite fatty infiltration.  IMPRESSION: Diffuse gallbladder wall thickening, with a positive ultrasonographic Murphy's sign, concerning for acute cholecystitis. Underlying sludge and mobile stones seen within the gallbladder. No evidence for obstruction.   Electronically  Signed   By: Garald Balding M.D.   On: 04/09/2014 04:29    Review of Systems  Constitutional: Positive for malaise/fatigue.  HENT: Negative.   Eyes: Negative.   Respiratory: Negative.   Gastrointestinal: Positive for abdominal pain. Negative for nausea and vomiting.  Genitourinary: Negative.   Musculoskeletal: Negative.   Skin: Negative.   Neurological: Positive for weakness.  Endo/Heme/Allergies: Negative.   Psychiatric/Behavioral: Negative.     Blood pressure 113/63, pulse 75, temperature 98.3 F (36.8 C), temperature source Oral, resp. rate 12, height 6' (1.829 m), weight 242 lb (109.77 kg), SpO2 92.00%. Physical Exam  Constitutional: He is oriented to person, place, and time. He appears well-developed and well-nourished.  HENT:  Head: Normocephalic and atraumatic.  Eyes: Pupils are equal, round, and reactive to light. No scleral icterus.  Neck: Normal range of motion. Neck supple.  Cardiovascular: Normal rate and regular rhythm.   Respiratory: Effort normal and breath sounds normal.  GI: There is tenderness in the right upper quadrant. There is positive Murphy's sign.  Musculoskeletal: Normal range of motion.  Neurological: He is alert and oriented to person, place, and time.  Skin: Skin is warm and dry.  Psychiatric: He has a normal mood and affect. His behavior is normal. Judgment and thought content normal.     Assessment/Plan Acute cholecystitis Admit IVF Antibiotics cipro 400 IV BID.  Will need laparoscopic cholecystectomy  Will try to set up for today.  Will discuss with Dr Donne Hazel.   Moon Budde A. 04/09/2014, 5:27 AM

## 2014-04-10 ENCOUNTER — Encounter: Payer: Self-pay | Admitting: General Surgery

## 2014-04-10 ENCOUNTER — Encounter (HOSPITAL_COMMUNITY): Payer: Self-pay | Admitting: General Surgery

## 2014-04-10 LAB — CBC
HEMATOCRIT: 38.8 % — AB (ref 39.0–52.0)
Hemoglobin: 13.2 g/dL (ref 13.0–17.0)
MCH: 30.3 pg (ref 26.0–34.0)
MCHC: 34 g/dL (ref 30.0–36.0)
MCV: 89.2 fL (ref 78.0–100.0)
Platelets: 224 10*3/uL (ref 150–400)
RBC: 4.35 MIL/uL (ref 4.22–5.81)
RDW: 12.4 % (ref 11.5–15.5)
WBC: 11.8 10*3/uL — ABNORMAL HIGH (ref 4.0–10.5)

## 2014-04-10 MED ORDER — CIPROFLOXACIN HCL 500 MG PO TABS: 500.0000 mg | ORAL_TABLET | Freq: Two times a day (BID) | ORAL | Status: DC

## 2014-04-10 MED ORDER — OXYCODONE HCL 5 MG PO TABS: 5.0000 mg | ORAL_TABLET | ORAL | Status: DC | PRN

## 2014-04-10 NOTE — Discharge Instructions (Signed)
Laparoscopic Cholecystectomy, Care After Refer to this sheet in the next few weeks. These instructions provide you with information on caring for yourself after your procedure. Your health care provider may also give you more specific instructions. Your treatment has been planned according to current medical practices, but problems sometimes occur. Call your health care provider if you have any problems or questions after your procedure. WHAT TO EXPECT AFTER THE PROCEDURE After your procedure, it is typical to have the following:  Pain at your incision sites. You will be given pain medicines to control the pain.  Mild nausea or vomiting. This should improve after the first 24 hours.  Bloating and possibly shoulder pain from the gas used during the procedure. This will improve after the first 24 hours. HOME CARE INSTRUCTIONS   Change bandages (dressings) as directed by your health care provider.  Keep the wound dry and clean. You may wash the wound gently with soap and water. Gently blot or dab the area dry.  Do not take baths or use swimming pools or hot tubs for 2 weeks or until your health care provider approves.  Only take over-the-counter or prescription medicines as directed by your health care provider.  Continue your normal diet as directed by your health care provider.  Do not lift anything heavier than 10 pounds (4.5 kg) until your health care provider approves.  Do not play contact sports for 1 week or until your health care provider approves. SEEK MEDICAL CARE IF:   You have redness, swelling, or increasing pain in the wound.  You notice yellowish-white fluid (pus) coming from the wound.  You have drainage from the wound that lasts longer than 1 day.  You notice a bad smell coming from the wound or dressing.  Your surgical cuts (incisions) break open. SEEK IMMEDIATE MEDICAL CARE IF:   You develop a rash.  You have difficulty breathing.  You have chest pain.  You  have a fever.  You have increasing pain in the shoulders (shoulder strap areas).  You have dizzy episodes or faint while standing.  You have severe abdominal pain.  You feel sick to your stomach (nauseous) or throw up (vomit) and this lasts for more than 1 day. Document Released: 10/13/2005 Document Revised: 08/03/2013 Document Reviewed: 05/25/2013 [facility] Patient Information 2014 [place].  CCS ______[facility] LAPAROSCOPIC SURGERY: POST OP INSTRUCTIONS Always review your discharge instruction sheet given to you by the facility where your surgery was performed. IF YOU HAVE DISABILITY OR FAMILY LEAVE FORMS, YOU MUST BRING THEM TO THE OFFICE FOR PROCESSING.   DO NOT GIVE THEM TO YOUR DOCTOR.  1. A prescription for pain medication may be given to you upon discharge.  Take your pain medication as prescribed, if needed.  If narcotic pain medicine is not needed, then you may take acetaminophen (Tylenol) or ibuprofen (Advil) as needed. 2. Take your usually prescribed medications unless otherwise directed. 3. If you need a refill on your pain medication, please contact your pharmacy.  They will contact our office to request authorization. Prescriptions will not be filled after 5pm or on week-ends. 4. You should follow a light diet the first few days after arrival home, such as soup and crackers, etc.  Be sure to include lots of fluids daily. 5. Most patients will experience some swelling and bruising in the area of the incisions.  Ice packs will help.  Swelling and bruising can take several days to resolve.  6. It is common  to experience some constipation if taking pain medication after surgery.  Increasing fluid intake and taking a stool softener (such as Colace) will usually help or prevent this problem from occurring.  A mild laxative (Milk of Magnesia or Miralax) should be taken according to package instructions if there are no bowel movements after 48  hours. 7. Unless discharge instructions indicate otherwise, you may remove your bandages 24-48 hours after surgery, and you may shower at that time.  You may have steri-strips (small skin tapes) in place directly over the incision.  These strips should be left on the skin for 7-10 days.  If your surgeon used skin glue on the incision, you may shower in 24 hours.  The glue will flake off over the next 2-3 weeks.  Any sutures or staples will be removed at the office during your follow-up visit. 8. ACTIVITIES:  You may resume regular (light) daily activities beginning the next day--such as daily self-care, walking, climbing stairs--gradually increasing activities as tolerated.  You may have sexual intercourse when it is comfortable.  Refrain from any heavy lifting or straining until approved by your doctor. a. You may drive when you are no longer taking prescription pain medication, you can comfortably wear a seatbelt, and you can safely maneuver your car and apply brakes. b. RETURN TO WORK:  __________________________________________________________ 9. You should see your doctor in the office for a follow-up appointment approximately 2-3 weeks after your surgery.  Make sure that you call for this appointment within a day or two after you arrive home to insure a convenient appointment time. 10. OTHER INSTRUCTIONS: __________________________________________________________________________________________________________________________ __________________________________________________________________________________________________________________________ WHEN TO CALL YOUR DOCTOR: 1. Fever over 101.0 2. Inability to urinate 3. Continued bleeding from incision. 4. Increased pain, redness, or drainage from the incision. 5. Increasing abdominal pain  The clinic staff is available to answer your questions during regular business hours.  Please dont hesitate to call and ask to speak to one of the nurses for  clinical concerns.  If you have a medical emergency, go to the nearest emergency room or call 911.  A surgeon from [facility] is always on call at the hospital. [address] [place], [place], [place]  [address] ? [address], [place], [place]   [address] [phone] ? [phone] ? FAX [phone] Web site: [email or web address]

## 2014-04-10 NOTE — Progress Notes (Signed)
1 Day Post-Op  Subjective: He has not had a BM, but ate breakfast and only taking tylenol for pain.  He is ready to go home.  Objective: Vital signs in last 24 hours: Temp:  [97.6 F (36.4 C)-98.4 F (36.9 C)] 97.6 F (36.4 C) (06/15 0622) Pulse Rate:  [63-81] 71 (06/15 0622) Resp:  [18-19] 18 (06/15 0622) BP: (116-131)/(61-65) 116/65 mmHg (06/15 0622) SpO2:  [92 %-94 %] 92 % (06/15 0622) Weight:  [111.948 kg (246 lb 12.8 oz)] 111.948 kg (246 lb 12.8 oz) (06/14 2013) Last BM Date: 04/08/14 Cardiac diet afebrile, VSS WBC is better but still slightly elevated  Intake/Output from previous day: 06/14 0701 - 06/15 0700 In: 1800 [I.V.:1800] Out: 1250 [Urine:1200; Blood:50] Intake/Output this shift:    General appearance: alert, cooperative and no distress GI: soft sore he has some sun burn from before surgery.  + BS, port sites look fine.  Lab Results:   Recent Labs  04/09/14 0535 04/10/14 0653  WBC 12.2* 11.8*  HGB 13.9 13.2  HCT 39.2 38.8*  PLT 197 224    BMET  Recent Labs  04/09/14 0304 04/09/14 0535  NA 138  --   K 4.4  --   CL 98  --   CO2 26  --   GLUCOSE 165*  --   BUN 17  --   CREATININE 1.01 0.87  CALCIUM 9.3  --    PT/INR No results found for this basename: LABPROT, INR,  in the last 72 hours   Recent Labs Lab 04/09/14 0304  AST 16  ALT 15  ALKPHOS 48  BILITOT 0.3  PROT 7.6  ALBUMIN 3.9     Lipase     Component Value Date/Time   LIPASE 51 04/09/2014 0304     Studies/Results: US Abdomen Limited  04/09/2014   CLINICAL DATA:  Right upper quadrant abdominal pain.  EXAM: US ABDOMEN LIMITED - RIGHT UPPER QUADRANT  COMPARISON:  None.  FINDINGS: Gallbladder:  Sludge and mobile stones are seen within the gallbladder, with stones measuring up to 0.6 cm in size. There is diffuse gallbladder wall thickening to 0.6 cm, with a positive ultrasonographic Murphy's sign, concerning for acute cholecystitis.  Common bile duct:  Diameter: 0.4 cm, within  normal limits in caliber.  Liver:  No focal lesion identified. Mildly heterogeneous parenchymal echogenicity, without definite fatty infiltration.  IMPRESSION: Diffuse gallbladder wall thickening, with a positive ultrasonographic Murphy's sign, concerning for acute cholecystitis. Underlying sludge and mobile stones seen within the gallbladder. No evidence for obstruction.   Electronically Signed   By: Garald Balding M.D.   On: 04/09/2014 04:29    Medications: . ciprofloxacin  400 mg Intravenous Q12H  . enoxaparin (LOVENOX) injection  40 mg Subcutaneous Daily  . lisinopril  40 mg Oral Daily  . pantoprazole  40 mg Oral Daily  . PARoxetine  20 mg Oral Daily    Assessment/Plan 1.  acute cholecystitis, laparoscopic cholecystectomy, 04/09/2014, Rolm Bookbinder, MD 2.  Hx of hypertension 3.  Anxiety 4.  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  I will get him set up for follow up in the office.  LOS: 1 day    Elverta Dimiceli 04/10/2014

## 2014-04-10 NOTE — Progress Notes (Signed)
Emmons Toth M. Yamira Papa, MD, FACS General, Bariatric, & Minimally Invasive Surgery [facility]  

## 2014-04-10 NOTE — Discharge Summary (Signed)
Physician Discharge Summary  Patient ID: Joel Hutchinson MRN: [id number] DOB/AGE: 49-27-1967 49 y.o.  Admit date: 04/09/2014 Discharge date: 04/10/2014  Admission Diagnoses: acute cholecystitis    Discharge Diagnoses:  1. acute cholecystitis 2. Hx of hypertension  3. Anxiety  4. Arthralgia 5.  Body mass index is 33.4  Active Problems:   Acute cholecystitis   PROCEDURES: Laparoscopic cholecystectomy, 04/09/2014, Rolm Bookbinder, MD     Hospital Course: pt presents with hx of RUQ abdominal pain. On and off since last Friday. Occurs at bed time and has gone away over a few hours. Last night it occurred around 11 and did not go away. Sharp, constant , Severe with radiation to back. Made better with pain meds but not gone all the way. U/S shows gallstones and thickened GB wall. Pt has positive sonographic Murphy sign.  He was admitted early Am by Dr. Brantley Stage.  He was seen later that AM and taken to the OR by Dr. Donne Hazel.  He had an acutely inflamed Gallbladder and the following Am his WBC was better but still up.  He took a regular diet and looked good.  We sent him home on 5 days of cipro.  Follow up in 3 weeks.  Port sites look fine and he was ready for discharge the following AM.   Condition on d/c:  Improved  Disposition: 01-Home or Self Care     Medication List         acetaminophen 500 MG tablet  Commonly known as:  TYLENOL  Take 1,000 mg by mouth every 6 (six) hours as needed for mild pain.     ciprofloxacin 500 MG tablet  Commonly known as:  CIPRO  Take 1 tablet (500 mg total) by mouth 2 (two) times daily.     lisinopril 40 MG tablet  Commonly known as:  PRINIVIL,ZESTRIL  Take 1 tablet (40 mg total) by mouth daily.     omeprazole 40 MG capsule  Commonly known as:  PRILOSEC  Take 1 capsule (40 mg total) by mouth daily.     oxyCODONE 5 MG immediate release tablet  Commonly known as:  ROXICODONE  Take 1-2 tablets (5-10 mg total) by mouth every 4 (four)  hours as needed for severe pain.     PARoxetine 20 MG tablet  Commonly known as:  PAXIL  Take 1 tablet (20 mg total) by mouth daily.           Follow-up Information   Call Kathlene November, MD. (As needed for medical issues.)    Specialty:  Internal Medicine   Contact information:   [phone] [address] [place] [address] [phone]       Follow up with [facility] On 05/02/2014. (Your appointment is at 3:00 PM, be there 30 minutes early for check in.)    Contact information:   [address]   [place] [address] [phone]       Signed: Earnstine Regal 04/10/2014, 2:53 PM

## 2014-04-10 NOTE — Discharge Summary (Signed)
Daniell Paradise M. Song Myre, MD, FACS General, Bariatric, & Minimally Invasive Surgery [facility]  

## 2014-04-10 NOTE — Progress Notes (Signed)
Discussed discharge summary with pt. Pt received Rx and work note. Pt did not have any questions. Pt ready and waiting for ride.

## 2014-04-11 ENCOUNTER — Telehealth (INDEPENDENT_AMBULATORY_CARE_PROVIDER_SITE_OTHER): Payer: Self-pay

## 2014-04-11 ENCOUNTER — Encounter (INDEPENDENT_AMBULATORY_CARE_PROVIDER_SITE_OTHER): Payer: Self-pay | Admitting: General Surgery

## 2014-04-11 NOTE — Telephone Encounter (Signed)
Pt states that he needs his work note changed and he needs his appt very early in the morning or at anytime on a Friday for his PO. I didn't see anything open. Please advise.

## 2014-04-11 NOTE — Telephone Encounter (Signed)
Called pt back and informed him of his new appt w/ Dr. Donne Hazel on 05/05/14 @ 1110.  He also informed me he needs his RTW note changed from 04/24/14 to 04/23/14 because his work week starts on Sunday.  Informed him that I will fax that letter to the number he provided [phone].

## 2014-04-24 ENCOUNTER — Ambulatory Visit: Payer: Self-pay | Admitting: Internal Medicine

## 2014-05-02 ENCOUNTER — Encounter (INDEPENDENT_AMBULATORY_CARE_PROVIDER_SITE_OTHER): Payer: BC Managed Care – PPO

## 2014-05-05 ENCOUNTER — Ambulatory Visit (INDEPENDENT_AMBULATORY_CARE_PROVIDER_SITE_OTHER): Payer: BC Managed Care – PPO | Admitting: General Surgery

## 2014-05-05 ENCOUNTER — Encounter (INDEPENDENT_AMBULATORY_CARE_PROVIDER_SITE_OTHER): Payer: Self-pay | Admitting: General Surgery

## 2014-05-05 VITALS — BP 126/78 | HR 74 | Temp 98.2°F | Ht 72.0 in | Wt 234.0 lb

## 2014-05-05 DIAGNOSIS — Z09 Encounter for follow-up examination after completed treatment for conditions other than malignant neoplasm: Secondary | ICD-10-CM

## 2014-05-05 NOTE — Progress Notes (Signed)
Subjective:     Patient ID: Joel Hutchinson, male   DOB: Mar 03, 1966, 48 y.o.   MRN: [id number]  HPI 48 yom s/p lap chole for acute cholecystitis.  Returns today doing well without complaints.  Eating well. bms normal. No n/v. No fevers  Review of Systems     Objective:   Physical Exam Healing incisions without infection, abd soft nontender    Assessment:     S/p lap chole     Plan:     He can return to full activity. I will see him back as needed.

## 2014-06-18 ENCOUNTER — Encounter: Payer: Self-pay | Admitting: Internal Medicine

## 2014-06-18 DIAGNOSIS — I4510 Unspecified right bundle-branch block: Secondary | ICD-10-CM | POA: Insufficient documentation

## 2014-06-27 DIAGNOSIS — K5792 Diverticulitis of intestine, part unspecified, without perforation or abscess without bleeding: Secondary | ICD-10-CM

## 2014-06-27 HISTORY — DX: Diverticulitis of intestine, part unspecified, without perforation or abscess without bleeding: K57.92

## 2014-07-24 ENCOUNTER — Encounter: Payer: Self-pay | Admitting: Internal Medicine

## 2014-07-24 ENCOUNTER — Ambulatory Visit (INDEPENDENT_AMBULATORY_CARE_PROVIDER_SITE_OTHER): Payer: BC Managed Care – PPO | Admitting: Internal Medicine

## 2014-07-24 VITALS — BP 124/72 | HR 78 | Temp 98.3°F | Wt 224.0 lb

## 2014-07-24 DIAGNOSIS — I4510 Unspecified right bundle-branch block: Secondary | ICD-10-CM

## 2014-07-24 DIAGNOSIS — R109 Unspecified abdominal pain: Secondary | ICD-10-CM

## 2014-07-24 DIAGNOSIS — R1032 Left lower quadrant pain: Secondary | ICD-10-CM | POA: Insufficient documentation

## 2014-07-24 LAB — POCT URINALYSIS DIPSTICK
Blood, UA: NEGATIVE
Glucose, UA: NEGATIVE
Ketones, UA: NEGATIVE
Leukocytes, UA: NEGATIVE
Nitrite, UA: NEGATIVE
PH UA: 5
PROTEIN UA: NEGATIVE
SPEC GRAV UA: 1.02
UROBILINOGEN UA: 0.2

## 2014-07-24 MED ORDER — CIPROFLOXACIN HCL 500 MG PO TABS
500.0000 mg | ORAL_TABLET | Freq: Two times a day (BID) | ORAL | Status: DC
Start: 2014-07-24 — End: 2014-07-27

## 2014-07-24 MED ORDER — METRONIDAZOLE 500 MG PO TABS: 500.0000 mg | ORAL_TABLET | Freq: Three times a day (TID) | ORAL | Status: DC

## 2014-07-24 NOTE — Progress Notes (Signed)
Pre visit review using our clinic review tool, if applicable. No additional management support is needed unless otherwise documented below in the visit note. 

## 2014-07-24 NOTE — Patient Instructions (Signed)
Bland diet Drink plenty of fluids Start taking antibiotics Get the CT done tomorrow ER if fever, chills, severe symptoms.

## 2014-07-24 NOTE — Progress Notes (Signed)
Subjective:    Patient ID: Joel Hutchinson, male    DOB: 1966-06-17, 48 y.o.   MRN: [id number]  DOS:  07/24/2014 Type of visit - description : acute  Interval history: Symptoms started about 3 days ago with mid  abdominal pain, shortly after the pain localized in the suprapubic area mostly on the left. He does have a history of backache, this time he is developing backache but is mostly on the left side and seems to be " connected" with the anterior abdominal pain. No worse with by mouth intake or moving his torso   ROS No fever chills, appetite is normal, and good tolerance with food. No nausea, vomiting, diarrhea or blood in the stools. BMs are irregular since he had his gallbladder out, last bowel movement was today. No dysuria, gross hematuria difficulty urinating  Past Medical History  Diagnosis Date  . HTN (hypertension)   . Anxiety   . Arthralgia     Past Surgical History  Procedure Laterality Date  . Nasal sinus surgery  1988  . Cholecystectomy N/A 04/09/2014    Procedure: LAPAROSCOPIC CHOLECYSTECTOMY;  Surgeon: Rolm Bookbinder, MD;  Location: [facility] OR;  Service: General;  Laterality: N/A;    History   Social History  . Marital Status: Married    Spouse Name: N/A    Number of Children: 0  . Years of Education: N/A   Occupational History  . truck driver    Social History Main Topics  . Smoking status: Former Research scientist (life sciences)  . Smokeless tobacco: Not on file     Comment: quit at age 48  . Alcohol Use: No  . Drug Use: No  . Sexual Activity: Not on file   Other Topics Concern  . Not on file   Social History Narrative  . No narrative on file        Medication List       This list is accurate as of: 07/24/14  5:50 PM.  Always use your most recent med list.               acetaminophen 500 MG tablet  Commonly known as:  TYLENOL  Take 1,000 mg by mouth every 6 (six) hours as needed for mild pain.     ciprofloxacin 500 MG tablet  Commonly known as:   CIPRO  Take 1 tablet (500 mg total) by mouth 2 (two) times daily.     lisinopril 40 MG tablet  Commonly known as:  PRINIVIL,ZESTRIL  Take 1 tablet (40 mg total) by mouth daily.     metroNIDAZOLE 500 MG tablet  Commonly known as:  FLAGYL  Take 1 tablet (500 mg total) by mouth 3 (three) times daily.     PARoxetine 20 MG tablet  Commonly known as:  PAXIL  Take 1 tablet (20 mg total) by mouth daily.           Objective:   Physical Exam  Abdominal:     BP 124/72  Pulse 78  Temp(Src) 98.3 F (36.8 C) (Oral)  Wt 224 lb (101.606 kg)  SpO2 96% General -- alert, well-developed, NAD.    HEENT-- Not pale or jaundice Lungs -- normal respiratory effort, no intercostal retractions, no accessory muscle use, and normal breath sounds.  Heart-- normal rate, regular rhythm, no murmur.  Abdomen-- Not distended, good bowel sounds,soft, + tender at the LLQ w/o rebound or rigidity. No mass,organomegaly. Question of L CVA tenderness   Extremities-- no pretibial edema bilaterally  Neurologic--  alert & oriented X3. Speech normal, gait appropriate for age, strength symmetric and appropriate for age.    Psych-- Cognition and judgment appear intact. Cooperative with normal attention span and concentration. No anxious or depressed appearing.        Assessment & Plan:   Left lower quadrant abdominal pain Presents with a three-day history of left lower quadrant abdominal pain,  Symptoms are quite suggestive of diverticulitis. No upper abdominal symptoms, no urinary symptoms, udip  negative. Plan: Start  Empiric Antibiotics CT of the abdomen, patient is unable to go today consequently we'll do first in the morning. UCX ER if symptoms severe. Further advice would results

## 2014-07-25 ENCOUNTER — Other Ambulatory Visit: Payer: Self-pay

## 2014-07-25 ENCOUNTER — Ambulatory Visit (HOSPITAL_BASED_OUTPATIENT_CLINIC_OR_DEPARTMENT_OTHER)
Admission: RE | Admit: 2014-07-25 | Discharge: 2014-07-25 | Disposition: A | Discharge status: Home / Self Care | Payer: BC Managed Care – PPO | Source: Ambulatory Visit | Attending: Internal Medicine | Admitting: Internal Medicine

## 2014-07-25 ENCOUNTER — Encounter (HOSPITAL_BASED_OUTPATIENT_CLINIC_OR_DEPARTMENT_OTHER): Payer: Self-pay

## 2014-07-25 DIAGNOSIS — M5137 Other intervertebral disc degeneration, lumbosacral region: Secondary | ICD-10-CM | POA: Insufficient documentation

## 2014-07-25 DIAGNOSIS — M47817 Spondylosis without myelopathy or radiculopathy, lumbosacral region: Secondary | ICD-10-CM | POA: Insufficient documentation

## 2014-07-25 DIAGNOSIS — K5732 Diverticulitis of large intestine without perforation or abscess without bleeding: Secondary | ICD-10-CM | POA: Diagnosis not present

## 2014-07-25 DIAGNOSIS — R109 Unspecified abdominal pain: Secondary | ICD-10-CM

## 2014-07-25 DIAGNOSIS — M51379 Other intervertebral disc degeneration, lumbosacral region without mention of lumbar back pain or lower extremity pain: Secondary | ICD-10-CM | POA: Insufficient documentation

## 2014-07-25 LAB — URINALYSIS, ROUTINE W REFLEX MICROSCOPIC
BILIRUBIN URINE: NEGATIVE
HGB URINE DIPSTICK: NEGATIVE
KETONES UR: NEGATIVE
Leukocytes, UA: NEGATIVE
Nitrite: NEGATIVE
PH: 5.5 (ref 5.0–8.0)
RBC / HPF: NONE SEEN (ref 0–?)
Specific Gravity, Urine: 1.025 (ref 1.000–1.030)
TOTAL PROTEIN, URINE-UPE24: NEGATIVE
URINE GLUCOSE: NEGATIVE
Urobilinogen, UA: 0.2 (ref 0.0–1.0)
WBC, UA: NONE SEEN (ref 0–?)

## 2014-07-25 MED ORDER — IOHEXOL 300 MG/ML  SOLN
100.0000 mL | Freq: Once | INTRAMUSCULAR | Status: AC | PRN
  Administered 2014-07-25: 100 mL via INTRAVENOUS

## 2014-07-26 LAB — URINE CULTURE
Colony Count: NO GROWTH
ORGANISM ID, BACTERIA: NO GROWTH

## 2014-07-27 ENCOUNTER — Telehealth: Payer: Self-pay | Admitting: Internal Medicine

## 2014-07-27 MED ORDER — AMOXICILLIN-POT CLAVULANATE 875-125 MG PO TABS: 1.0000 | ORAL_TABLET | Freq: Two times a day (BID) | ORAL | Status: DC

## 2014-07-27 NOTE — Telephone Encounter (Signed)
Please advise 

## 2014-07-27 NOTE — Telephone Encounter (Signed)
Spoke with the patient, diarrhea since yesterday, no blood in the stools. Pain is better. Diarrhea could be related to antibiotics. Plan: Switch to augmentin, prescription sent. Knows to call me if the diarrhea doesn't stop, if he has fever, chills or blood in the stools.

## 2014-07-27 NOTE — Telephone Encounter (Signed)
Caller name: Gerald Stabs  Call back [phone]   Reason for call:  Having diarrhea from antibiotics and would like to speak with Dr. Larose Kells cma

## 2014-09-15 ENCOUNTER — Ambulatory Visit (INDEPENDENT_AMBULATORY_CARE_PROVIDER_SITE_OTHER): Payer: 59 | Admitting: Internal Medicine

## 2014-09-15 ENCOUNTER — Encounter: Payer: Self-pay | Admitting: Internal Medicine

## 2014-09-15 VITALS — BP 123/81 | HR 58 | Temp 97.8°F | Wt 228.0 lb

## 2014-09-15 DIAGNOSIS — Z23 Encounter for immunization: Secondary | ICD-10-CM

## 2014-09-15 DIAGNOSIS — Z0000 Encounter for general adult medical examination without abnormal findings: Secondary | ICD-10-CM

## 2014-09-15 DIAGNOSIS — R7309 Other abnormal glucose: Secondary | ICD-10-CM

## 2014-09-15 DIAGNOSIS — R7303 Prediabetes: Secondary | ICD-10-CM

## 2014-09-15 HISTORY — DX: Prediabetes: R73.03

## 2014-09-15 LAB — LIPID PANEL
CHOLESTEROL: 162 mg/dL (ref 0–200)
HDL: 36.1 mg/dL — ABNORMAL LOW (ref >39.00)
LDL CALC: 110 mg/dL — AB (ref 0–99)
NONHDL: 125.9
Total CHOL/HDL Ratio: 4
Triglycerides: 78 mg/dL (ref 0.0–149.0)
VLDL: 15.6 mg/dL (ref 0.0–40.0)

## 2014-09-15 LAB — BASIC METABOLIC PANEL
BUN: 16 mg/dL (ref 6–23)
CHLORIDE: 103 meq/L (ref 96–112)
CO2: 31 mEq/L (ref 19–32)
Calcium: 9.1 mg/dL (ref 8.4–10.5)
Creatinine, Ser: 0.9 mg/dL (ref 0.4–1.5)
GFR: 94.07 mL/min (ref >60.00)
Glucose, Bld: 109 mg/dL — ABNORMAL HIGH (ref 70–99)
POTASSIUM: 4.5 meq/L (ref 3.5–5.1)
SODIUM: 138 meq/L (ref 135–145)

## 2014-09-15 LAB — CBC WITH DIFFERENTIAL/PLATELET
Basophils Absolute: 0 10*3/uL (ref 0.0–0.1)
Basophils Relative: 0.6 % (ref 0.0–3.0)
EOS PCT: 0.7 % (ref 0.0–5.0)
Eosinophils Absolute: 0 10*3/uL (ref 0.0–0.7)
HEMATOCRIT: 46.4 % (ref 39.0–52.0)
Hemoglobin: 15.7 g/dL (ref 13.0–17.0)
LYMPHS ABS: 2.4 10*3/uL (ref 0.7–4.0)
Lymphocytes Relative: 43.5 % (ref 12.0–46.0)
MCHC: 33.8 g/dL (ref 30.0–36.0)
MCV: 90.7 fl (ref 78.0–100.0)
MONO ABS: 0.5 10*3/uL (ref 0.1–1.0)
Monocytes Relative: 8.4 % (ref 3.0–12.0)
Neutro Abs: 2.6 10*3/uL (ref 1.4–7.7)
Neutrophils Relative %: 46.8 % (ref 43.0–77.0)
Platelets: 178 10*3/uL (ref 150.0–400.0)
RBC: 5.11 Mil/uL (ref 4.22–5.81)
RDW: 13 % (ref 11.5–15.5)
WBC: 5.5 10*3/uL (ref 4.0–10.5)

## 2014-09-15 LAB — HEMOGLOBIN A1C: Hgb A1c MFr Bld: 5.9 % (ref 4.6–6.5)

## 2014-09-15 LAB — TSH: TSH: 1.47 u[IU]/mL (ref 0.35–4.50)

## 2014-09-15 MED ORDER — LISINOPRIL 40 MG PO TABS: 40.0000 mg | ORAL_TABLET | Freq: Every day | ORAL | Status: DC

## 2014-09-15 MED ORDER — PAROXETINE HCL 20 MG PO TABS: 20.0000 mg | ORAL_TABLET | Freq: Every day | ORAL | Status: DC

## 2014-09-15 NOTE — Patient Instructions (Signed)
Get your blood work before you leave    Please come back to the office in 6 months for a routine check up     Depending on your labs,  you may need to come back  fasting Schedule the visit at the front desk

## 2014-09-15 NOTE — Progress Notes (Signed)
Pre visit review using our clinic review tool, if applicable. No additional management support is needed unless otherwise documented below in the visit note. 

## 2014-09-15 NOTE — Assessment & Plan Note (Addendum)
Td > 10 years--- today Had a flu shot  Diet -exercise discussed Labs  Other issues  Prediabetes, has been doing better with diet and exercise, recheck A1c Hypertension well controlled, ambulatory BPs are checked frequently and normal FH CAD, father , age 49, he had DM -- plan is control CV RF S/p single episode of diverticulitis-- high fiber diet discussed, for cscope next year

## 2014-09-15 NOTE — Progress Notes (Signed)
   Subjective:    Patient ID: Joel Hutchinson, male    DOB: November 20, 1965, 48 y.o.   MRN: [id number]  DOS:  09/15/2014 Type of visit - description : CPX Interval history: doing well    ROS No  CP, SOB Denies  nausea, vomiting diarrhea, blood in the stools (-) cough, sputum production (-) wheezing, chest congestion No dysuria, gross hematuria, difficulty urinating  No anxiety, depression --- on paxil, sx controlled  Denies suicidal ideas     Past Medical History  Diagnosis Date  . HTN (hypertension)   . Anxiety   . Arthralgia   . Diverticulitis 06-2014  . Prediabetes 09/15/2014    Past Surgical History  Procedure Laterality Date  . Nasal sinus surgery  1988  . Cholecystectomy N/A 04/09/2014    Procedure: LAPAROSCOPIC CHOLECYSTECTOMY;  Surgeon: Rolm Bookbinder, MD;  Location: [facility] OR;  Service: General;  Laterality: N/A;    History   Social History  . Marital Status: Married    Spouse Name: N/A    Number of Children: 0  . Years of Education: N/A   Occupational History  . truck driver    Social History Main Topics  . Smoking status: Former Research scientist (life sciences)  . Smokeless tobacco: Not on file     Comment: quit at age 50  . Alcohol Use: No  . Drug Use: No  . Sexual Activity: Not on file   Other Topics Concern  . Not on file   Social History Narrative   Household-- pt and wife     Family History  Problem Relation Age of Onset  . CAD Father     MI age 86, died few weeks later   . Diabetes Father     father, bro, GP  . Stroke Other     GM  . Atrial fibrillation Mother   . Colon cancer Neg Hx   . Prostate cancer Neg Hx        Medication List       This list is accurate as of: 09/15/14 11:59 PM.  Always use your most recent med list.               acetaminophen 500 MG tablet  Commonly known as:  TYLENOL  Take 1,000 mg by mouth every 6 (six) hours as needed for mild pain.     lisinopril 40 MG tablet  Commonly known as:  PRINIVIL,ZESTRIL  Take 1 tablet  (40 mg total) by mouth daily.     PARoxetine 20 MG tablet  Commonly known as:  PAXIL  Take 1 tablet (20 mg total) by mouth daily.           Objective:   Physical Exam BP 123/81 mmHg  Pulse 58  Temp(Src) 97.8 F (36.6 C) (Oral)  Wt 228 lb (103.42 kg)  SpO2 93%  General -- alert, well-developed, NAD.  Neck --no thyromegaly  HEENT-- Not pale.  Lungs -- normal respiratory effort, no intercostal retractions, no accessory muscle use, and normal breath sounds.  Heart-- normal rate, regular rhythm, no murmur.  Abdomen-- Not distended, good bowel sounds,soft, non-tender. Extremities-- no pretibial edema bilaterally  Neurologic--  alert & oriented X3. Speech normal, gait appropriate for age, strength symmetric and appropriate for age.  Psych-- Cognition and judgment appear intact. Cooperative with normal attention span and concentration. No anxious or depressed appearing.       Assessment & Plan:

## 2015-03-16 ENCOUNTER — Encounter: Payer: Self-pay | Admitting: Internal Medicine

## 2015-03-16 ENCOUNTER — Ambulatory Visit (INDEPENDENT_AMBULATORY_CARE_PROVIDER_SITE_OTHER): Payer: 59 | Admitting: Internal Medicine

## 2015-03-16 VITALS — BP 128/64 | HR 57 | Temp 98.2°F | Ht 72.0 in | Wt 238.4 lb

## 2015-03-16 DIAGNOSIS — R7309 Other abnormal glucose: Secondary | ICD-10-CM | POA: Diagnosis not present

## 2015-03-16 DIAGNOSIS — F419 Anxiety disorder, unspecified: Secondary | ICD-10-CM | POA: Diagnosis not present

## 2015-03-16 DIAGNOSIS — R7303 Prediabetes: Secondary | ICD-10-CM

## 2015-03-16 DIAGNOSIS — I10 Essential (primary) hypertension: Secondary | ICD-10-CM | POA: Diagnosis not present

## 2015-03-16 LAB — BASIC METABOLIC PANEL
BUN: 11 mg/dL (ref 6–23)
CHLORIDE: 104 meq/L (ref 96–112)
CO2: 32 meq/L (ref 19–32)
Calcium: 9.3 mg/dL (ref 8.4–10.5)
Creatinine, Ser: 0.89 mg/dL (ref 0.40–1.50)
GFR: 96.32 mL/min (ref >60.00)
Glucose, Bld: 116 mg/dL — ABNORMAL HIGH (ref 70–99)
Potassium: 4.6 mEq/L (ref 3.5–5.1)
Sodium: 139 mEq/L (ref 135–145)

## 2015-03-16 LAB — HEMOGLOBIN A1C: HEMOGLOBIN A1C: 5.5 % (ref 4.6–6.5)

## 2015-03-16 MED ORDER — LISINOPRIL 40 MG PO TABS: 40.0000 mg | ORAL_TABLET | Freq: Every day | ORAL | Status: DC

## 2015-03-16 MED ORDER — PAROXETINE HCL 20 MG PO TABS: 20.0000 mg | ORAL_TABLET | Freq: Every day | ORAL | Status: DC

## 2015-03-16 NOTE — Progress Notes (Signed)
   Subjective:    Patient ID: Joel Hutchinson, male    DOB: 1966-08-05, 49 y.o.   MRN: [id number]  DOS:  03/16/2015 Type of visit - description : f/u Interval history: Hypertension, good compliance of medication Prediabetes, since the last time he was here he has gained weight, mostly due to the lack of exercise and not eating healthier. He changed jobs and his routine was changed. No ambulatory BPs   Review of Systems Denies chest pain, difficulty breathing, palpitations. No nausea or vomiting  Past Medical History  Diagnosis Date  . HTN (hypertension)   . Anxiety   . Arthralgia   . Diverticulitis 06-2014  . Prediabetes 09/15/2014    Past Surgical History  Procedure Laterality Date  . Nasal sinus surgery  1988  . Cholecystectomy N/A 04/09/2014    Procedure: LAPAROSCOPIC CHOLECYSTECTOMY;  Surgeon: Rolm Bookbinder, MD;  Location: [facility] OR;  Service: General;  Laterality: N/A;    History   Social History  . Marital Status: Married    Spouse Name: N/A  . Number of Children: 0  . Years of Education: N/A   Occupational History  . truck driver    Social History Main Topics  . Smoking status: Former Research scientist (life sciences)  . Smokeless tobacco: Not on file     Comment: quit at age 48  . Alcohol Use: No  . Drug Use: No  . Sexual Activity: Not on file   Other Topics Concern  . Not on file   Social History Narrative   Household-- pt and wife        Medication List       This list is accurate as of: 03/16/15 11:59 PM.  Always use your most recent med list.               acetaminophen 500 MG tablet  Commonly known as:  TYLENOL  Take 1,000 mg by mouth every 6 (six) hours as needed for mild pain.     lisinopril 40 MG tablet  Commonly known as:  PRINIVIL,ZESTRIL  Take 1 tablet (40 mg total) by mouth daily.     PARoxetine 20 MG tablet  Commonly known as:  PAXIL  Take 1 tablet (20 mg total) by mouth daily.           Objective:   Physical Exam BP 128/64 mmHg  Pulse  57  Temp(Src) 98.2 F (36.8 C) (Oral)  Ht 6' (1.829 m)  Wt 238 lb 6 oz (108.126 kg)  BMI 32.32 kg/m2  SpO2 97%  General:   Well developed, well nourished . NAD.  HEENT:  Normocephalic . Face symmetric, atraumatic Lungs:  CTA B Normal respiratory effort, no intercostal retractions, no accessory muscle use. Heart: RRR,  no murmur.  No pretibial edema bilaterally  Skin: Not pale. Not jaundice Neurologic:  alert & oriented X3.  Speech normal, gait appropriate for age and unassisted Psych--  Cognition and judgment appear intact.  Cooperative with normal attention span and concentration.  Behavior appropriate. No anxious or depressed appearing.       Assessment & Plan:

## 2015-03-16 NOTE — Progress Notes (Signed)
Pre visit review using our clinic review tool, if applicable. No additional management support is needed unless otherwise documented below in the visit note. 

## 2015-03-16 NOTE — Assessment & Plan Note (Signed)
History of anxiety in crowded places, symptoms currently well controlled on Paxil

## 2015-03-16 NOTE — Patient Instructions (Signed)
Get your blood work before you leave    Come back to the office by 08-2015   for a physical exam  Please schedule an appointment at the front desk    Come back fasting        

## 2015-03-16 NOTE — Assessment & Plan Note (Signed)
Continue with lisinopril, check a BMP.RFs

## 2015-03-18 NOTE — Assessment & Plan Note (Signed)
Has gained weight since the last time he was here, recommend to go back to his healthier diet and exercise. Patient request a A1c to be checked

## 2015-06-18 ENCOUNTER — Other Ambulatory Visit: Payer: Self-pay | Admitting: Internal Medicine

## 2015-09-14 ENCOUNTER — Telehealth: Payer: Self-pay | Admitting: Behavioral Health

## 2015-09-14 ENCOUNTER — Encounter: Payer: Self-pay | Admitting: Behavioral Health

## 2015-09-14 NOTE — Telephone Encounter (Signed)
Pre-Visit Call completed with patient and chart updated.   Pre-Visit Info documented in Specialty Comments under SnapShot.    

## 2015-09-17 ENCOUNTER — Ambulatory Visit (INDEPENDENT_AMBULATORY_CARE_PROVIDER_SITE_OTHER): Payer: 59 | Admitting: Internal Medicine

## 2015-09-17 ENCOUNTER — Encounter: Payer: Self-pay | Admitting: Gastroenterology

## 2015-09-17 ENCOUNTER — Encounter: Payer: Self-pay | Admitting: Internal Medicine

## 2015-09-17 VITALS — BP 126/62 | HR 74 | Temp 98.2°F | Ht 72.0 in | Wt 244.4 lb

## 2015-09-17 DIAGNOSIS — Z09 Encounter for follow-up examination after completed treatment for conditions other than malignant neoplasm: Secondary | ICD-10-CM | POA: Insufficient documentation

## 2015-09-17 DIAGNOSIS — Z125 Encounter for screening for malignant neoplasm of prostate: Secondary | ICD-10-CM

## 2015-09-17 DIAGNOSIS — Z114 Encounter for screening for human immunodeficiency virus [HIV]: Secondary | ICD-10-CM

## 2015-09-17 DIAGNOSIS — R399 Unspecified symptoms and signs involving the genitourinary system: Secondary | ICD-10-CM

## 2015-09-17 DIAGNOSIS — Z0000 Encounter for general adult medical examination without abnormal findings: Secondary | ICD-10-CM | POA: Diagnosis not present

## 2015-09-17 DIAGNOSIS — Z1211 Encounter for screening for malignant neoplasm of colon: Secondary | ICD-10-CM

## 2015-09-17 LAB — COMPREHENSIVE METABOLIC PANEL
ALK PHOS: 44 U/L (ref 39–117)
ALT: 17 U/L (ref 0–53)
AST: 18 U/L (ref 0–37)
Albumin: 4.3 g/dL (ref 3.5–5.2)
BILIRUBIN TOTAL: 0.6 mg/dL (ref 0.2–1.2)
BUN: 15 mg/dL (ref 6–23)
CO2: 27 mEq/L (ref 19–32)
Calcium: 9.1 mg/dL (ref 8.4–10.5)
Chloride: 106 mEq/L (ref 96–112)
Creatinine, Ser: 0.93 mg/dL (ref 0.40–1.50)
GFR: 91.36 mL/min (ref >60.00)
GLUCOSE: 114 mg/dL — AB (ref 70–99)
Potassium: 4.5 mEq/L (ref 3.5–5.1)
SODIUM: 140 meq/L (ref 135–145)
Total Protein: 6.9 g/dL (ref 6.0–8.3)

## 2015-09-17 LAB — URINALYSIS, ROUTINE W REFLEX MICROSCOPIC
BILIRUBIN URINE: NEGATIVE
Ketones, ur: NEGATIVE
Leukocytes, UA: NEGATIVE
NITRITE: NEGATIVE
Specific Gravity, Urine: 1.025 (ref 1.000–1.030)
Urine Glucose: NEGATIVE
Urobilinogen, UA: 0.2 (ref 0.0–1.0)
pH: 6 (ref 5.0–8.0)

## 2015-09-17 LAB — LIPID PANEL
Cholesterol: 162 mg/dL (ref 0–200)
HDL: 40.3 mg/dL (ref >39.00)
LDL Cholesterol: 106 mg/dL — ABNORMAL HIGH (ref 0–99)
NONHDL: 121.49
Total CHOL/HDL Ratio: 4
Triglycerides: 76 mg/dL (ref 0.0–149.0)
VLDL: 15.2 mg/dL (ref 0.0–40.0)

## 2015-09-17 LAB — PSA: PSA: 4.46 ng/mL — ABNORMAL HIGH (ref 0.10–4.00)

## 2015-09-17 LAB — HEMOGLOBIN A1C: HEMOGLOBIN A1C: 5.8 % (ref 4.6–6.5)

## 2015-09-17 LAB — TSH: TSH: 1.19 u[IU]/mL (ref 0.35–4.50)

## 2015-09-17 MED ORDER — PAROXETINE HCL 20 MG PO TABS: 20.0000 mg | ORAL_TABLET | Freq: Every day | ORAL | Status: DC

## 2015-09-17 MED ORDER — LISINOPRIL 40 MG PO TABS: 40.0000 mg | ORAL_TABLET | Freq: Every day | ORAL | Status: DC

## 2015-09-17 NOTE — Progress Notes (Signed)
Pre visit review using our clinic review tool, if applicable. No additional management support is needed unless otherwise documented below in the visit note. 

## 2015-09-17 NOTE — Patient Instructions (Signed)
Get your blood work before you leave    Next visit  for a routine checkup in 6 months, nonfasting.    (15 minutes) Please schedule an appointment at the front desk  

## 2015-09-17 NOTE — Assessment & Plan Note (Signed)
Prediabetes: Check A1c HTN: Seems well-controlled Anxiety: On SSRIs, symptoms controlled, rf RTC 6 months

## 2015-09-17 NOTE — Assessment & Plan Note (Addendum)
Td -2015  Had a flu shot ~07-2015 Colon cancer screening: Episode of diverticulitis last year, age 49, refer to GI Prostate cancer screening: DRE normal, check a PSA. Occasionally has urinary urgency. Check a UA  Diet -exercise: was doing very well for a while, now regaining weight. Encouraged to go back to a healthier diet. Labs: CMP, FLP, CBC, UA, A1c, PSA

## 2015-09-17 NOTE — Progress Notes (Signed)
Subjective:    Patient ID: Joel Hutchinson, male    DOB: 11/05/1965, 49 y.o.   MRN: [id number]  DOS:  09/17/2015 Type of visit - description : CPX Interval history: Since the last office visit, his diet has not been as good and he is regaining weight. Also less active than before   Wt Readings from Last 3 Encounters:  09/17/15 244 lb 6 oz (110.848 kg)  03/16/15 238 lb 6 oz (108.126 kg)  09/15/14 228 lb (103.42 kg)    Review of Systems Constitutional: No fever. No chills. No unexplained wt changes. No unusual sweats  HEENT: No dental problems, no ear discharge, no facial swelling, no voice changes. No eye discharge, no eye  redness , no  intolerance to light   Respiratory: No wheezing , no  difficulty breathing. No cough , no mucus production  Cardiovascular: No CP, no leg swelling , no  Palpitations  GI: no nausea, no vomiting, no diarrhea , no  abdominal pain.  No blood in the stools. No dysphagia, no odynophagia    Endocrine: No polyphagia, no polyuria , no polydipsia  GU: No dysuria, gross hematuria, difficulty urinating. Occasionally has urinary urgency, no actual frequency  Musculoskeletal: Occasional elbow and wrist pain,  symptoms decreased with Tylenol. Currently with no back pain issues.  Skin: No change in the color of the skin, palor , no  Rash  Allergic, immunologic: No environmental allergies , no  food allergies  Neurological: No dizziness no  syncope. No headaches. No diplopia, no slurred, no slurred speech, no motor deficits, no facial  Numbness  Hematological: No enlarged lymph nodes, no easy bruising , no unusual bleedings  Psychiatry: No suicidal ideas, no hallucinations, no beavior problems, no confusion.  No unusual/severe anxiety, no depression   Past Medical History  Diagnosis Date  . HTN (hypertension)   . Anxiety   . Arthralgia   . Diverticulitis 06-2014  . Prediabetes 09/15/2014    Past Surgical History  Procedure Laterality Date    . Nasal sinus surgery  1988  . Cholecystectomy N/A 04/09/2014    Procedure: LAPAROSCOPIC CHOLECYSTECTOMY;  Surgeon: Rolm Bookbinder, MD;  Location: [facility] OR;  Service: General;  Laterality: N/A;    Social History   Social History  . Marital Status: Married    Spouse Name: N/A  . Number of Children: 0  . Years of Education: N/A   Occupational History  . truck driver    Social History Main Topics  . Smoking status: Former Research scientist (life sciences)  . Smokeless tobacco: Not on file     Comment: quit at age 72  . Alcohol Use: No  . Drug Use: No  . Sexual Activity: Not on file   Other Topics Concern  . Not on file   Social History Narrative   Household-- pt and wife     Family History  Problem Relation Age of Onset  . CAD Father     MI age 20, died few weeks later   . Diabetes Father     father, bro, GP  . Stroke Other     GM  . Atrial fibrillation Mother   . Colon cancer Neg Hx   . Prostate cancer Neg Hx        Medication List       This list is accurate as of: 09/17/15  4:59 PM.  Always use your most recent med list.  acetaminophen 500 MG tablet  Commonly known as:  TYLENOL  Take 1,000 mg by mouth every 6 (six) hours as needed for mild pain.     lisinopril 40 MG tablet  Commonly known as:  PRINIVIL,ZESTRIL  Take 1 tablet (40 mg total) by mouth daily.     PARoxetine 20 MG tablet  Commonly known as:  PAXIL  Take 1 tablet (20 mg total) by mouth daily.           Objective:   Physical Exam BP 126/62 mmHg  Pulse 74  Temp(Src) 98.2 F (36.8 C) (Oral)  Ht 6' (1.829 m)  Wt 244 lb 6 oz (110.848 kg)  BMI 33.14 kg/m2  SpO2 98% General:   Well developed, well nourished . NAD.  HEENT:  Normocephalic . Face symmetric, atraumatic Neck: No thyromegaly Lungs:  CTA B Normal respiratory effort, no intercostal retractions, no accessory muscle use. Heart: RRR,  no murmur.  no pretibial edema bilaterally  Abdomen:  Not distended, soft, non-tender. No  rebound or rigidity. Rectal:  External abnormalities: none. Normal sphincter tone. No rectal masses or tenderness.  Stool brown  Prostate: Prostate gland firm and smooth, no enlargement, nodularity, tenderness, mass, asymmetry or induration.  Skin: Not pale. Not jaundice Neurologic:  alert & oriented X3.  Speech normal, gait appropriate for age and unassisted Psych--  Cognition and judgment appear intact.  Cooperative with normal attention span and concentration.  Behavior appropriate. No anxious or depressed appearing.     Assessment & Plan:  Assessment> Prediabetes HTN Anxiety   Plan: Prediabetes: Check A1c HTN: Seems well-controlled Anxiety: On SSRIs, symptoms controlled, rf RTC 6 months

## 2015-09-18 LAB — CBC WITH DIFFERENTIAL/PLATELET
BASOS PCT: 0.1 % (ref 0.0–3.0)
Basophils Absolute: 0 10*3/uL (ref 0.0–0.1)
EOS PCT: 1 % (ref 0.0–5.0)
Eosinophils Absolute: 0.1 10*3/uL (ref 0.0–0.7)
HCT: 47.9 % (ref 39.0–52.0)
Hemoglobin: 15.8 g/dL (ref 13.0–17.0)
LYMPHS ABS: 2.3 10*3/uL (ref 0.7–4.0)
Lymphocytes Relative: 42.9 % (ref 12.0–46.0)
MCHC: 33 g/dL (ref 30.0–36.0)
MCV: 93.2 fl (ref 78.0–100.0)
MONO ABS: 0.4 10*3/uL (ref 0.1–1.0)
Monocytes Relative: 6.7 % (ref 3.0–12.0)
NEUTROS PCT: 49.3 % (ref 43.0–77.0)
Neutro Abs: 2.6 10*3/uL (ref 1.4–7.7)
PLATELETS: 191 10*3/uL (ref 150.0–400.0)
RBC: 5.14 Mil/uL (ref 4.22–5.81)
RDW: 12.9 % (ref 11.5–15.5)
WBC: 5.3 10*3/uL (ref 4.0–10.5)

## 2015-09-18 LAB — URINE CULTURE
Colony Count: NO GROWTH
ORGANISM ID, BACTERIA: NO GROWTH

## 2015-09-18 LAB — HIV ANTIBODY (ROUTINE TESTING W REFLEX): HIV 1&2 Ab, 4th Generation: NONREACTIVE

## 2015-09-19 ENCOUNTER — Telehealth: Payer: Self-pay | Admitting: Internal Medicine

## 2015-09-19 MED ORDER — SULFAMETHOXAZOLE-TRIMETHOPRIM 800-160 MG PO TABS: 1.0000 | ORAL_TABLET | Freq: Two times a day (BID) | ORAL | Status: DC

## 2015-09-19 NOTE — Telephone Encounter (Signed)
FYI. Saw where you tried to call on 09/17/2015.

## 2015-09-19 NOTE — Telephone Encounter (Signed)
Called, no answer.

## 2015-09-19 NOTE — Telephone Encounter (Signed)
PSA elevated, he did have some urinary symptoms. Rest of labs normal. This was discussed with the patient, we agreed to do a trial with antibiotics (?prostatitis). Will call Bactrim DS twice a day for 2 weeks. pt to call if nausea, vomiting, diarrhea, rash or any other problems.

## 2015-09-19 NOTE — Telephone Encounter (Signed)
Pt states he is returning PCP call for lab results.    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

## 2015-11-09 ENCOUNTER — Ambulatory Visit (AMBULATORY_SURGERY_CENTER): Payer: Self-pay

## 2015-11-09 VITALS — Ht 72.0 in | Wt 245.0 lb

## 2015-11-09 DIAGNOSIS — Z1211 Encounter for screening for malignant neoplasm of colon: Secondary | ICD-10-CM

## 2015-11-09 MED ORDER — SUPREP BOWEL PREP KIT 17.5-3.13-1.6 GM/177ML PO SOLN
1.0000 | Freq: Once | ORAL | Status: DC
Start: 2015-11-09 — End: 2015-11-23

## 2015-11-09 NOTE — Progress Notes (Signed)
No allergies to eggs or soy No past problems with anesthesia No home oxygen No diet/weight loss meds  Has email and internet; registered for emmi 

## 2015-11-23 ENCOUNTER — Encounter: Payer: Self-pay | Admitting: Gastroenterology

## 2015-11-23 ENCOUNTER — Ambulatory Visit (AMBULATORY_SURGERY_CENTER): Payer: 59 | Admitting: Gastroenterology

## 2015-11-23 VITALS — BP 109/70 | HR 57 | Temp 97.0°F | Resp 14 | Ht 72.0 in | Wt 245.0 lb

## 2015-11-23 DIAGNOSIS — K621 Rectal polyp: Secondary | ICD-10-CM | POA: Diagnosis not present

## 2015-11-23 DIAGNOSIS — D128 Benign neoplasm of rectum: Secondary | ICD-10-CM

## 2015-11-23 DIAGNOSIS — Z1211 Encounter for screening for malignant neoplasm of colon: Secondary | ICD-10-CM

## 2015-11-23 DIAGNOSIS — D129 Benign neoplasm of anus and anal canal: Secondary | ICD-10-CM

## 2015-11-23 MED ORDER — SODIUM CHLORIDE 0.9 % IV SOLN: 500.0000 mL | INTRAVENOUS | Status: DC

## 2015-11-23 NOTE — Progress Notes (Signed)
Report to PACU, RN, vss, BBS= Clear.  

## 2015-11-23 NOTE — Op Note (Signed)
[place]  [address]. [place], [id number]   COLONOSCOPY PROCEDURE REPORT  PATIENT: Joel, Hutchinson  MR#: [id number] BIRTHDATE: 11-20-1965 , 49  yrs. old GENDER: male ENDOSCOPIST: Harl Bowie, MD REFERRED [id number] Larose Kells, M.D. PROCEDURE DATE:  11/23/2015 PROCEDURE:   Colonoscopy, screening and Colonoscopy with snare polypectomy First Screening Colonoscopy - Avg.  risk and is 50 yrs.  old or older Yes.  Prior Negative Screening - Now for repeat screening. N/A  History of Adenoma - Now for follow-up colonoscopy & has been > or = to 3 yrs.  N/A  Polyps removed today? Yes ASA CLASS:   Class I INDICATIONS:Screening for colonic neoplasia and Colorectal Neoplasm Risk Assessment for this procedure is average risk. MEDICATIONS: Propofol 300 mg IV  DESCRIPTION OF PROCEDURE:   After the risks benefits and alternatives of the procedure were thoroughly explained, informed consent was obtained.  The digital rectal exam revealed no abnormalities of the rectum.   The LB [id number]  endoscope was introduced through the anus and advanced to the terminal ileum which was intubated for a short distance. No adverse events experienced.   The quality of the prep was good.  The instrument was then slowly withdrawn as the colon was fully examined. Estimated blood loss is zero unless otherwise noted in this procedure report.  COLON FINDINGS: The examined terminal ileum appeared to be normal. There was mild diverticulosis noted throughout the entire examined colon.   The examination was otherwise normal.   A sessile polyp ranging between 3-12mm in size was found in the rectum.  A polypectomy was performed with a cold snare.  The resection was complete, the polyp tissue was completely retrieved and sent to histology.  Retroflexed views revealed internal hemorrhoids. The time to cecum = 4.4 Withdrawal time = 15.2   The scope was withdrawn and the procedure  completed. COMPLICATIONS: There were no immediate complications.  ENDOSCOPIC IMPRESSION: 1.   The examined terminal ileum appeared to be normal 2.   There was mild diverticulosis noted throughout the entire examined colon 3.   The examination was otherwise normal 4.   Sessile polyp ranging between 3-32mm in size was found in the rectum; polypectomy was performed with a cold snare  RECOMMENDATIONS: 1.  Await pathology results 2.  If the polyp(s) removed today are proven to be adenomatous (pre-cancerous) polyps, you will need a repeat colonoscopy in 5 years.  Otherwise you should continue to follow colorectal cancer screening guidelines for "routine risk" patients with colonoscopy in 10 years.  You will receive a letter within 1-2 weeks with the results of your biopsy as well as final recommendations.  Please call my office if you have not received a letter after 3 weeks.  eSigned:  Harl Bowie, MD 11/23/2015 9:02 AM    PATIENT NAME:  Joel, Hutchinson MR#: [id number]

## 2015-11-23 NOTE — Patient Instructions (Signed)
YOU HAD AN ENDOSCOPIC PROCEDURE TODAY AT THE [facility]:   Refer to the procedure report that was given to you for any specific questions about what was found during the examination.  If the procedure report does not answer your questions, please call your gastroenterologist to clarify.  If you requested that your care partner not be given the details of your procedure findings, then the procedure report has been included in a sealed envelope for you to review at your convenience later.  YOU SHOULD EXPECT: Some feelings of bloating in the abdomen. Passage of more gas than usual.  Walking can help get rid of the air that was put into your GI tract during the procedure and reduce the bloating. If you had a lower endoscopy (such as a colonoscopy or flexible sigmoidoscopy) you may notice spotting of blood in your stool or on the toilet paper. If you underwent a bowel prep for your procedure, you may not have a normal bowel movement for a few days.  Please Note:  You might notice some irritation and congestion in your nose or some drainage.  This is from the oxygen used during your procedure.  There is no need for concern and it should clear up in a day or so.  SYMPTOMS TO REPORT IMMEDIATELY:   Following lower endoscopy (colonoscopy or flexible sigmoidoscopy):  Excessive amounts of blood in the stool  Significant tenderness or worsening of abdominal pains  Swelling of the abdomen that is new, acute  Fever of 100F or higher   For urgent or emergent issues, a gastroenterologist can be reached at any hour by calling [phone].   DIET: Your first meal following the procedure should be a small meal and then it is ok to progress to your normal diet. Heavy or fried foods are harder to digest and may make you feel nauseous or bloated.  Likewise, meals heavy in dairy and vegetables can increase bloating.  Drink plenty of fluids but you should avoid alcoholic beverages for 24  hours.  ACTIVITY:  You should plan to take it easy for the rest of today and you should NOT DRIVE or use heavy machinery until tomorrow (because of the sedation medicines used during the test).    FOLLOW UP: Our staff will call the number listed on your records the next business day following your procedure to check on you and address any questions or concerns that you may have regarding the information given to you following your procedure. If we do not reach you, we will leave a message.  However, if you are feeling well and you are not experiencing any problems, there is no need to return our call.  We will assume that you have returned to your regular daily activities without incident.  If any biopsies were taken you will be contacted by phone or by letter within the next 1-3 weeks.  Please call us at [phone] if you have not heard about the biopsies in 3 weeks.    SIGNATURES/CONFIDENTIALITY: You and/or your care partner have signed paperwork which will be entered into your electronic medical record.  These signatures attest to the fact that that the information above on your After Visit Summary has been reviewed and is understood.  Full responsibility of the confidentiality of this discharge information lies with you and/or your care-partner.  Polyp, diverticulosis, high fiber diet-handouts given  Repeat colonoscopy will be determined by pathology.   

## 2015-11-23 NOTE — Progress Notes (Signed)
Called to room to assist during endoscopic procedure.  Patient ID and intended procedure confirmed with present staff. Received instructions for my participation in the procedure from the performing physician.  

## 2015-11-26 ENCOUNTER — Telehealth: Payer: Self-pay | Admitting: *Deleted

## 2015-11-26 NOTE — Telephone Encounter (Signed)
  Follow up Call-  Call back number 11/23/2015  Post procedure Call Back phone  # [phone]  Permission to leave phone message Yes     Patient questions:  Do you have a fever, pain , or abdominal swelling? No. Pain Score  0 *  Have you tolerated food without any problems? Yes.    Have you been able to return to your normal activities? Yes.    Do you have any questions about your discharge instructions: Diet   No. Medications  No. Follow up visit  No.  Do you have questions or concerns about your Care? No.  Actions: * If pain score is 4 or above: No action needed, pain <4.

## 2015-12-11 ENCOUNTER — Encounter: Payer: Self-pay | Admitting: Gastroenterology

## 2016-03-18 ENCOUNTER — Ambulatory Visit (INDEPENDENT_AMBULATORY_CARE_PROVIDER_SITE_OTHER): Payer: 59 | Admitting: Internal Medicine

## 2016-03-18 ENCOUNTER — Encounter: Payer: Self-pay | Admitting: Internal Medicine

## 2016-03-18 VITALS — BP 124/76 | HR 65 | Temp 98.1°F | Ht 72.0 in | Wt 243.2 lb

## 2016-03-18 DIAGNOSIS — F419 Anxiety disorder, unspecified: Secondary | ICD-10-CM | POA: Diagnosis not present

## 2016-03-18 DIAGNOSIS — I10 Essential (primary) hypertension: Secondary | ICD-10-CM | POA: Diagnosis not present

## 2016-03-18 DIAGNOSIS — R972 Elevated prostate specific antigen [PSA]: Secondary | ICD-10-CM

## 2016-03-18 DIAGNOSIS — Z09 Encounter for follow-up examination after completed treatment for conditions other than malignant neoplasm: Secondary | ICD-10-CM

## 2016-03-18 LAB — BASIC METABOLIC PANEL
BUN: 15 mg/dL (ref 6–23)
CHLORIDE: 105 meq/L (ref 96–112)
CO2: 28 mEq/L (ref 19–32)
CREATININE: 0.86 mg/dL (ref 0.40–1.50)
Calcium: 9.5 mg/dL (ref 8.4–10.5)
GFR: 99.8 mL/min (ref >60.00)
GLUCOSE: 114 mg/dL — AB (ref 70–99)
Potassium: 4.4 mEq/L (ref 3.5–5.1)
Sodium: 140 mEq/L (ref 135–145)

## 2016-03-18 LAB — PSA: PSA: 1.32 ng/mL (ref 0.10–4.00)

## 2016-03-18 MED ORDER — LISINOPRIL 40 MG PO TABS: 40.0000 mg | ORAL_TABLET | Freq: Every day | ORAL | Status: DC

## 2016-03-18 MED ORDER — PAROXETINE HCL 20 MG PO TABS: 20.0000 mg | ORAL_TABLET | Freq: Every day | ORAL | Status: DC

## 2016-03-18 NOTE — Progress Notes (Signed)
Pre visit review using our clinic review tool, if applicable. No additional management support is needed unless otherwise documented below in the visit note. 

## 2016-03-18 NOTE — Patient Instructions (Signed)
GO TO THE LAB : Get the blood work     GO TO THE FRONT DESK Schedule your next appointment for a  physical exam in 6 months  

## 2016-03-18 NOTE — Progress Notes (Signed)
Subjective:    Patient ID: Joel Hutchinson, male    DOB: 05-19-1966, 50 y.o.   MRN: [id number]  DOS:  03/18/2016 Type of visit - description : Follow-up Interval history: 6 months ago, PSA was found to be slightly elevated, status post Bactrim, here for follow-up. Good medication compliance  Review of Systems  GU: asymptomatic, no dysuria, hematuria, difficulty urinating or urinary frequency. No nausea, vomiting, diarrhea Past Medical History  Diagnosis Date  . HTN (hypertension)   . Anxiety   . Arthralgia   . Diverticulitis 06-2014  . Prediabetes 09/15/2014    Past Surgical History  Procedure Laterality Date  . Nasal sinus surgery  1988  . Cholecystectomy N/A 04/09/2014    Procedure: LAPAROSCOPIC CHOLECYSTECTOMY;  Surgeon: Rolm Bookbinder, MD;  Location: [facility] OR;  Service: General;  Laterality: N/A;    Social History   Social History  . Marital Status: Married    Spouse Name: N/A  . Number of Children: 0  . Years of Education: N/A   Occupational History  . truck driver    Social History Main Topics  . Smoking status: Former Research scientist (life sciences)  . Smokeless tobacco: Never Used     Comment: quit at age 38  . Alcohol Use: No  . Drug Use: No  . Sexual Activity: Not on file   Other Topics Concern  . Not on file   Social History Narrative   Household-- pt and wife        Medication List       This list is accurate as of: 03/18/16  5:18 PM.  Always use your most recent med list.               acetaminophen 500 MG tablet  Commonly known as:  TYLENOL  Take 1,000 mg by mouth every 6 (six) hours as needed for mild pain.     lisinopril 40 MG tablet  Commonly known as:  PRINIVIL,ZESTRIL  Take 1 tablet (40 mg total) by mouth daily.     PARoxetine 20 MG tablet  Commonly known as:  PAXIL  Take 1 tablet (20 mg total) by mouth daily.           Objective:   Physical Exam BP 124/76 mmHg  Pulse 65  Temp(Src) 98.1 F (36.7 C) (Oral)  Ht 6' (1.829 m)  Wt 243  lb 4 oz (110.337 kg)  BMI 32.98 kg/m2  SpO2 95% General:   Well developed, well nourished . NAD.  HEENT:  Normocephalic . Face symmetric, atraumatic Lungs:  CTA B Normal respiratory effort, no intercostal retractions, no accessory muscle use. Heart: RRR,  no murmur.  No pretibial edema bilaterally  Rectal:  External abnormalities: none. Normal sphincter tone. No rectal masses or tenderness.  Stool brown  Prostate: Prostate gland firm and smooth, no enlargement, nodularity, tenderness, mass, asymmetry or induration.  Skin: Not pale. Not jaundice Neurologic:  alert & oriented X3.  Speech normal, gait appropriate for age and unassisted Psych--  Cognition and judgment appear intact.  Cooperative with normal attention span and concentration.  Behavior appropriate. No anxious or depressed appearing.      Assessment & Plan:  Assessment> Prediabetes HTN Anxiety   Plan: Increase PSA: PSA was 4.46 months ago, was asx, UCX (-) DRE (-) . S/p bactrim,  he remains asx, DRE today is negative again. Check PSA. Further advise with results. Meaning of  slightly elevated PSA discuss with patient HTN: Check a BMP . Continue lisinopril Anxiety: Reports  good control of symptoms, RF Paxil RTC 6 months, CPX

## 2016-03-18 NOTE — Assessment & Plan Note (Signed)
Increase PSA: PSA was 4.46 months ago, was asx, UCX (-) DRE (-) . S/p bactrim,  he remains asx, DRE today is negative again. Check PSA. Further advise with results. Meaning of  slightly elevated PSA discuss with patient HTN: Check a BMP . Continue lisinopril Anxiety: Reports good control of symptoms, RF Paxil RTC 6 months, CPX

## 2016-06-19 IMAGING — CT CT ABD-PELV W/ CM
2 of 5 series · 17 of 46 positions shown, 19 images · IV contrast (APPLIED)
Comparison: 04/09/2014

CLINICAL DATA: Lower abdominal pain and tenderness.

EXAM:
CT ABDOMEN AND PELVIS WITH CONTRAST
TECHNIQUE: Multidetector CT imaging of the abdomen and pelvis was performed
using the standard protocol following bolus administration of
intravenous contrast.
CONTRAST:  100mL OMNIPAQUE IOHEXOL 300 MG/ML  SOLN

[Series 2: abd/pelvis 5.0 b31f · axial · 0.85mm/px · z∈[-596,-171]mm · 14 of 96 slices shown, 16 images]
[im 6/96  soft-tissue]
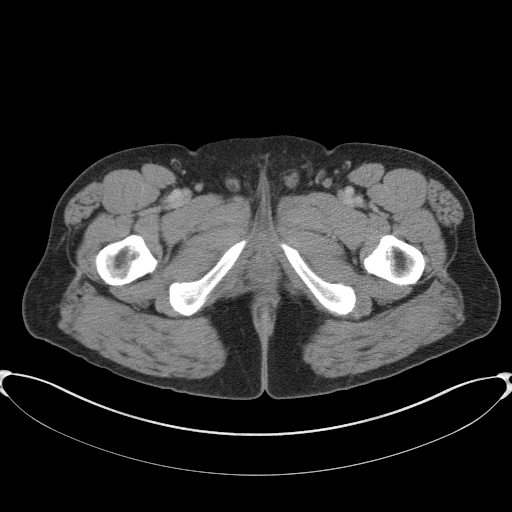
[im 6/96  bone]
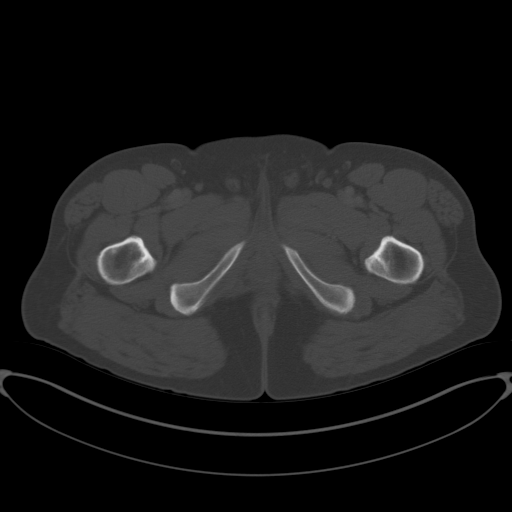
[im 11/96  soft-tissue]
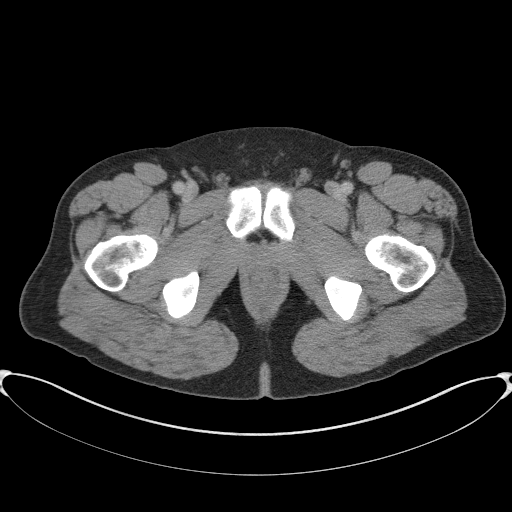
[im 21/96  soft-tissue]
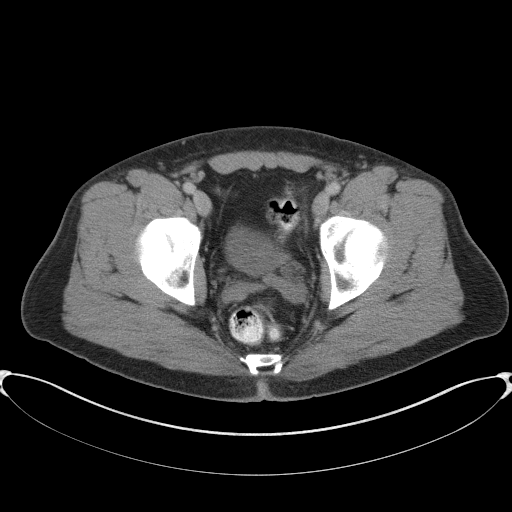
[im 26/96  soft-tissue]
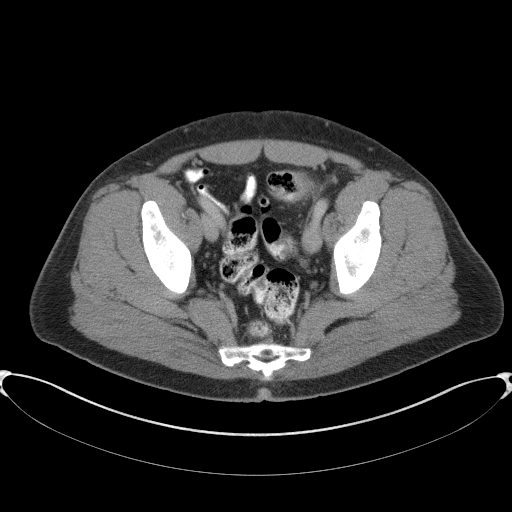
[im 31/96  soft-tissue]
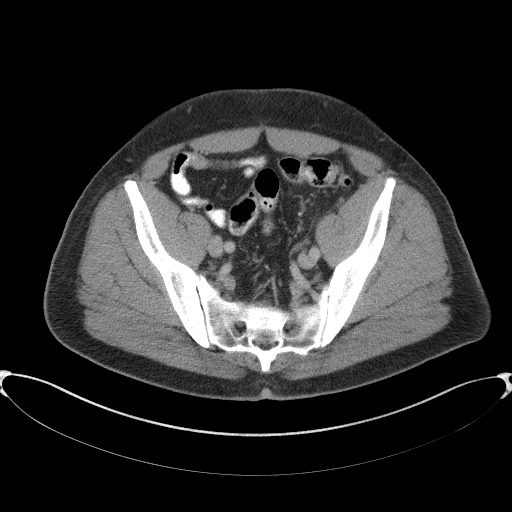
[im 41/96  soft-tissue]
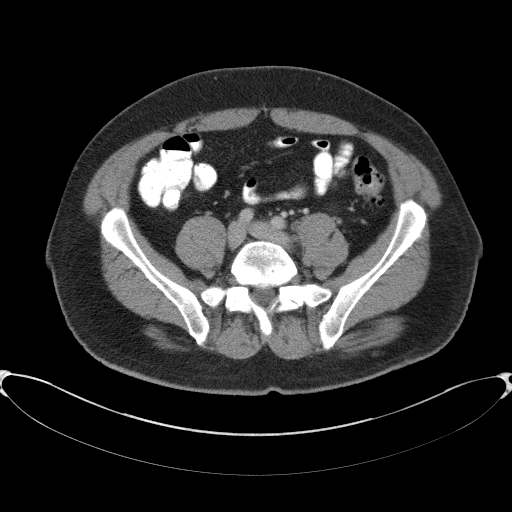
[im 46/96  soft-tissue]
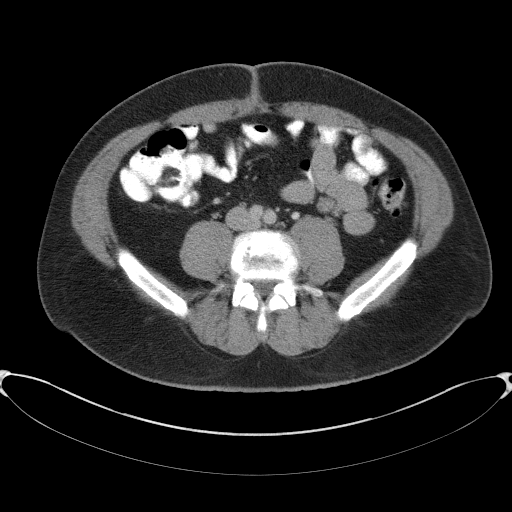
[im 51/96  soft-tissue]
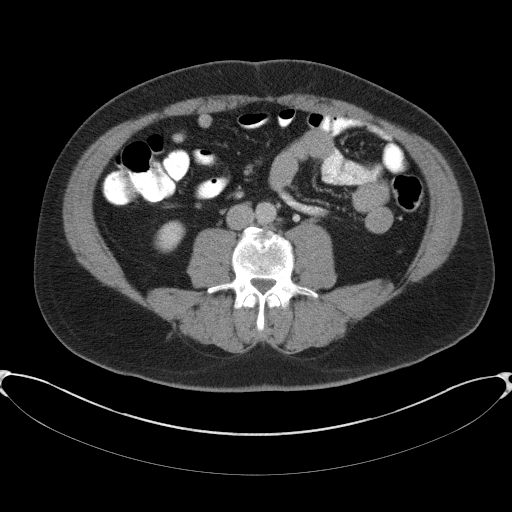
[im 56/96  soft-tissue]
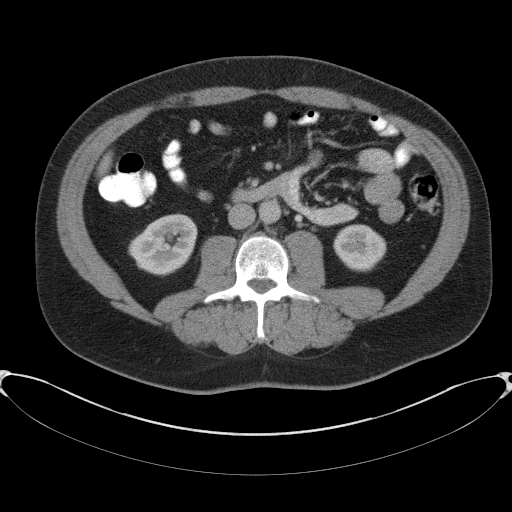
[im 56/96  bone]
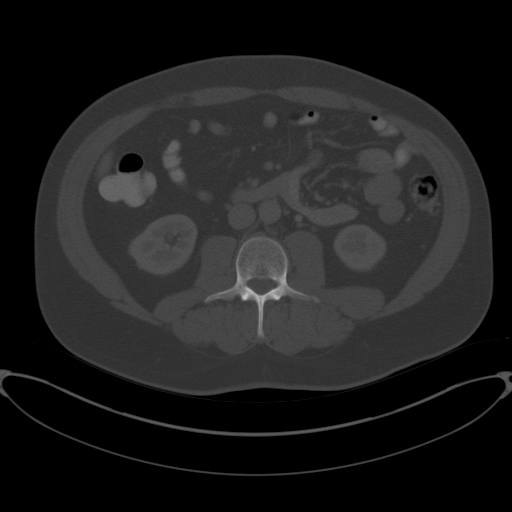
[im 66/96  soft-tissue]
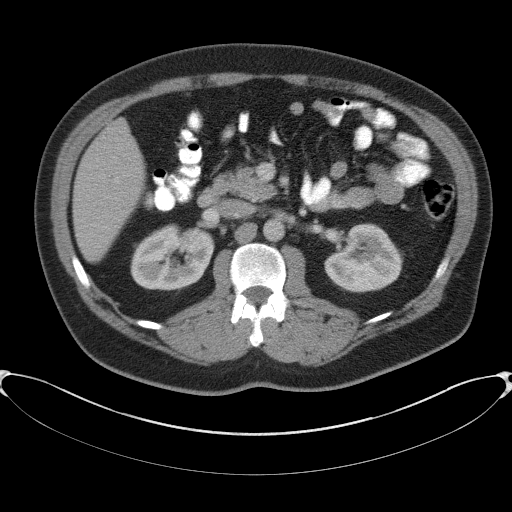
[im 71/96  soft-tissue]
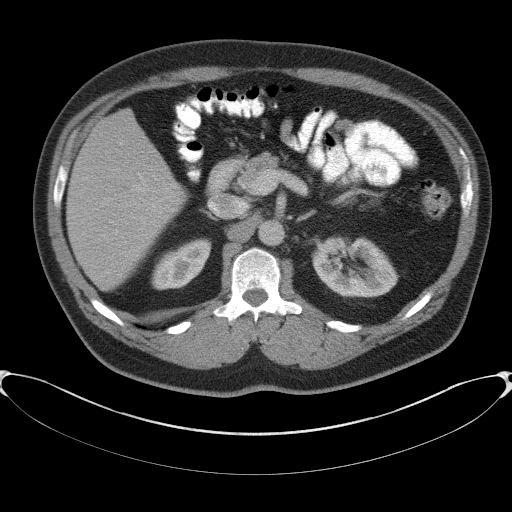
[im 76/96  soft-tissue]
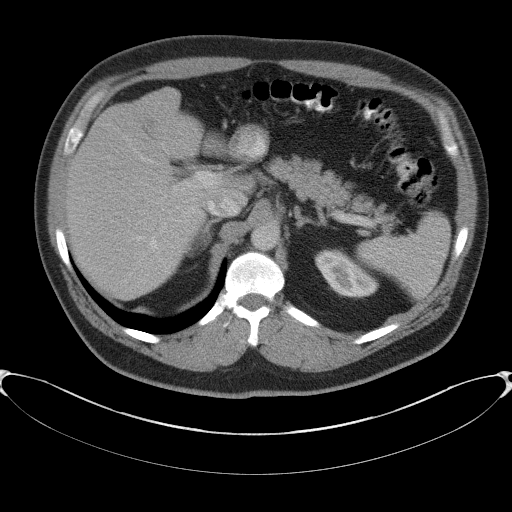
[im 86/96  soft-tissue]
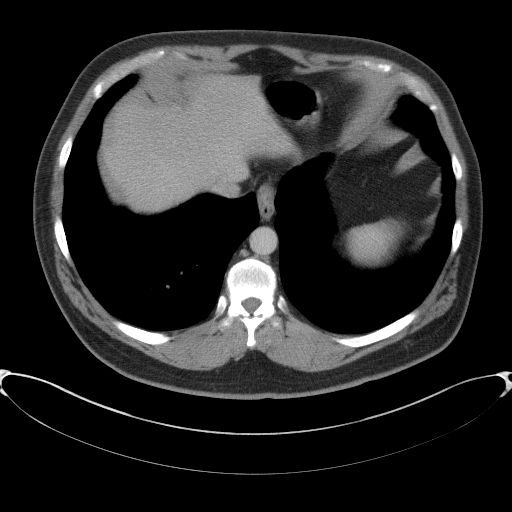
[im 91/96  soft-tissue]
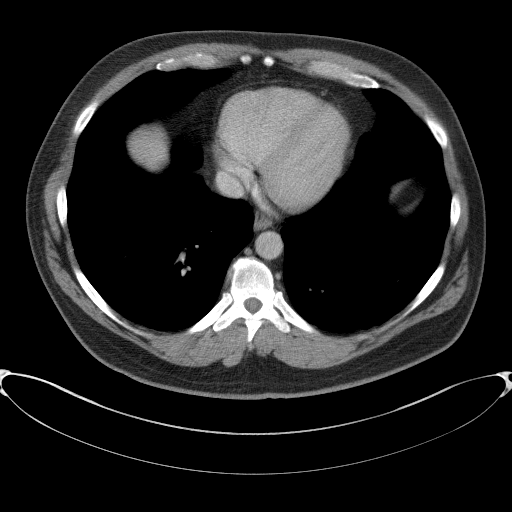

[Series 4: abd/pelvis 3.0 coronal · coronal · 0.87mm/px · 3 of 98 slices shown]
[im 33/98  soft-tissue]
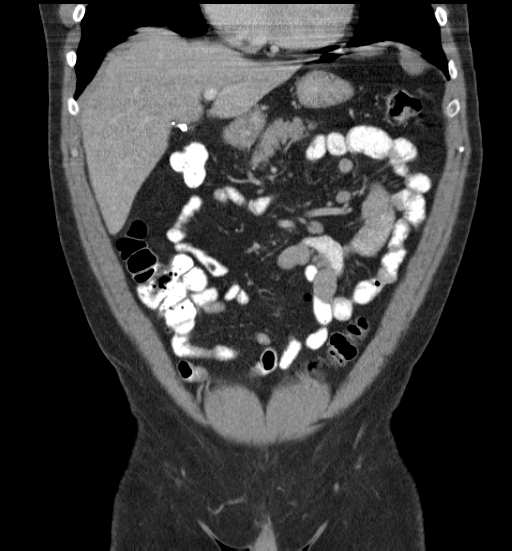
[im 44/98  soft-tissue]
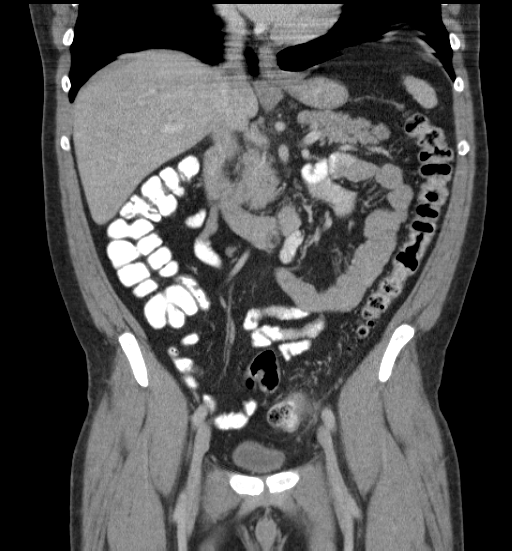
[im 54/98  soft-tissue]
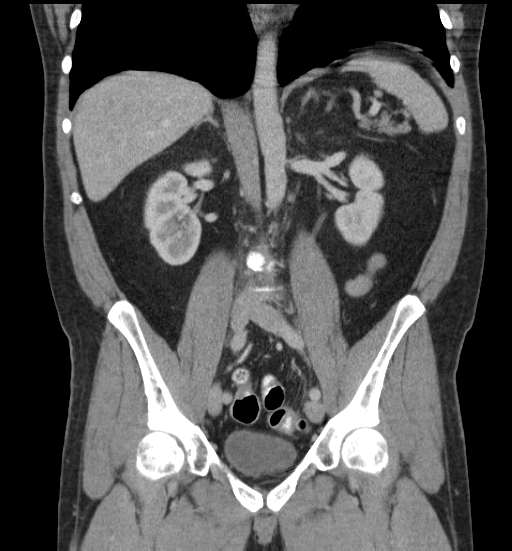

[17 of 46 positions shown; findings below may reference images not displayed]

FINDINGS: Lower chest:  Unremarkable

Hepatobiliary: Prior cholecystectomy

Pancreas: Unremarkable

Spleen: Unremarkable

Adrenals/Urinary Tract: Unremarkable

Stomach/Bowel: Descending and sigmoid colon diverticulosis with mild
acute diverticulitis at the junction of the descending and sigmoid
colon as shown on image 45 series 4. No abscess or extraluminal gas
observed. No significant ascites.

Vascular/Lymphatic: Unremarkable

Reproductive: Unremarkable

Other: Negative

Musculoskeletal: Spondylosis and degenerative disc disease at L4-5
and L5-S1 with Schmorl's nodes along the inferior endplates of L3
and L4. Mild grade 1 retrolisthesis at L4-5 and L5-S1.
IMPRESSION: 1. Mild acute diverticulitis of the junction of the descending and
sigmoid colon. Underlying diverticulosis noted.
2. Lower lumbar spondylosis and degenerative disc disease.

## 2016-06-27 ENCOUNTER — Other Ambulatory Visit: Payer: Self-pay | Admitting: Internal Medicine

## 2016-09-26 ENCOUNTER — Encounter: Payer: Self-pay | Admitting: Internal Medicine

## 2016-09-26 ENCOUNTER — Ambulatory Visit (INDEPENDENT_AMBULATORY_CARE_PROVIDER_SITE_OTHER): Payer: BLUE CROSS/BLUE SHIELD | Admitting: Internal Medicine

## 2016-09-26 VITALS — BP 122/68 | HR 70 | Temp 98.7°F | Resp 14 | Ht 72.0 in | Wt 245.1 lb

## 2016-09-26 DIAGNOSIS — Z0000 Encounter for general adult medical examination without abnormal findings: Secondary | ICD-10-CM

## 2016-09-26 DIAGNOSIS — R739 Hyperglycemia, unspecified: Secondary | ICD-10-CM | POA: Diagnosis not present

## 2016-09-26 DIAGNOSIS — Z125 Encounter for screening for malignant neoplasm of prostate: Secondary | ICD-10-CM

## 2016-09-26 LAB — PSA: PSA: 0.56 ng/mL (ref 0.10–4.00)

## 2016-09-26 LAB — LIPID PANEL
Cholesterol: 167 mg/dL (ref 0–200)
HDL: 36.3 mg/dL — AB (ref >39.00)
LDL Cholesterol: 117 mg/dL — ABNORMAL HIGH (ref 0–99)
NONHDL: 130.8
TRIGLYCERIDES: 70 mg/dL (ref 0.0–149.0)
Total CHOL/HDL Ratio: 5
VLDL: 14 mg/dL (ref 0.0–40.0)

## 2016-09-26 LAB — BASIC METABOLIC PANEL
BUN: 16 mg/dL (ref 6–23)
CHLORIDE: 105 meq/L (ref 96–112)
CO2: 27 meq/L (ref 19–32)
CREATININE: 0.92 mg/dL (ref 0.40–1.50)
Calcium: 9.2 mg/dL (ref 8.4–10.5)
GFR: 92.13 mL/min (ref >60.00)
Glucose, Bld: 109 mg/dL — ABNORMAL HIGH (ref 70–99)
Potassium: 4.4 mEq/L (ref 3.5–5.1)
Sodium: 141 mEq/L (ref 135–145)

## 2016-09-26 LAB — HEMOGLOBIN A1C: Hgb A1c MFr Bld: 5.8 % (ref 4.6–6.5)

## 2016-09-26 LAB — AST: AST: 14 U/L (ref 0–37)

## 2016-09-26 LAB — ALT: ALT: 15 U/L (ref 0–53)

## 2016-09-26 MED ORDER — PAROXETINE HCL 20 MG PO TABS: 20.0000 mg | ORAL_TABLET | Freq: Every day | ORAL | 3 refills | Status: DC

## 2016-09-26 MED ORDER — LISINOPRIL 40 MG PO TABS: 40.0000 mg | ORAL_TABLET | Freq: Every day | ORAL | 3 refills | Status: DC

## 2016-09-26 NOTE — Progress Notes (Signed)
Subjective:    Patient ID: Joel Hutchinson, male    DOB: [birth year], 50 y.o.   MRN: [id number]  DOS:  09/26/2016 Type of visit - description : cpx Interval history: No major concerns. Good medication compliance.   Review of Systems Constitutional: No fever. No chills. No unexplained wt changes. No unusual sweats  HEENT: No dental problems, no ear discharge, no facial swelling, no voice changes. No eye discharge, no eye  redness , no  intolerance to light   Respiratory: No wheezing , no  difficulty breathing. No cough , no mucus production  Cardiovascular: No CP, no leg swelling , no  Palpitations  GI: no nausea, no vomiting, no diarrhea , no  abdominal pain.  No blood in the stools. No dysphagia, no odynophagia    Endocrine: No polyphagia, no polyuria , no polydipsia  GU: No dysuria, gross hematuria, difficulty urinating. No urinary urgency, no frequency.  Musculoskeletal: No joint swellings or unusual aches or pains. 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 No headaches. No diplopia, no slurred, no slurred speech, no motor deficits, no facial  Numbness  Hematological: No enlarged lymph nodes, no easy bruising , no unusual bleedings  Psychiatry: No suicidal ideas, no hallucinations, no beavior problems, no confusion.  No unusual/severe anxiety, no depression   Past Medical History:  Diagnosis Date  . Anxiety   . Arthralgia   . Diverticulitis 06-2014  . HTN (hypertension)   . Prediabetes 09/15/2014    Past Surgical History:  Procedure Laterality Date  . CHOLECYSTECTOMY N/A 04/09/2014   Procedure: LAPAROSCOPIC CHOLECYSTECTOMY;  Surgeon: Rolm Bookbinder, MD;  Location: [place];  Service: General;  Laterality: N/A;  . NASAL SINUS SURGERY  1988     Social History   Social History  . Marital status: Married    Spouse name: N/A  . Number of children: 0  . 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 Smoking status: Former Research scientist (life sciences)  . Smokeless tobacco: Never Used     Comment: quit at age 42  . Alcohol use No  . Drug use: No  . Sexual activity: Not on file   Other Topics Concern  . Not on file   Social History Narrative   Household-- pt and wife     Family History  Problem Relation Age of Onset  . CAD Father     MI age 36, died few weeks later   . Diabetes Father     father, bro, GP  . Stroke Other     GM  . Atrial fibrillation Mother   . Colon cancer Neg Hx   . Prostate cancer Neg Hx       Medication List       Accurate as of 09/26/16 11:59 PM. Always use your most recent med list.          acetaminophen 500 MG tablet Commonly known as:  TYLENOL Take 1,000 mg by mouth every 6 (six) hours as needed for mild pain.   lisinopril 40 MG tablet Commonly known as:  PRINIVIL,ZESTRIL Take 1 tablet (40 mg total) by mouth daily.   PARoxetine 20 MG tablet Commonly known as:  PAXIL Take  1 tablet (20 mg total) by mouth daily.          Objective:   Physical Exam BP 122/68 (BP Location: Left Arm, Patient Position: Sitting, Cuff Size: Normal)   Pulse 70   Temp 98.7 F (37.1 C) (Oral)   Resp 14   Ht 6' (1.829 m)   Wt 245 lb 2 oz (111.2 kg)   SpO2 97%   BMI 33.24 kg/m  General:   Well developed, well nourished . NAD.  Neck: No  thyromegaly  HEENT:  Normocephalic . Face symmetric, atraumatic Lungs:  CTA B Normal respiratory effort, no intercostal retractions, no accessory muscle use. Heart: RRR,  no murmur.  No pretibial edema bilaterally  Abdomen:  Not distended, soft, non-tender. No rebound or rigidity.   Skin: Exposed areas without rash. Not pale. Not jaundice Neurologic:  alert & oriented X3.  Speech normal, gait appropriate  for age and unassisted Strength symmetric and appropriate for age.  Psych: Cognition and judgment appear intact.  Cooperative with normal attention span and concentration.  Behavior appropriate. No anxious or depressed appearing.      Assessment & Plan:   Assessment> Prediabetes HTN Anxiety, h/o panic attacks    Plan: Increased PSA: After he was treated with antibiotics, PSA came back normal, recheck  today. He is asx HTN: Continue lisinopril, no ambulatory BPs, BPs here are all normal. RTC one year.

## 2016-09-26 NOTE — Patient Instructions (Signed)
GO TO THE LAB : Get the blood work     GO TO THE FRONT DESK Schedule your next appointment for a  Physical in 1 year  

## 2016-09-26 NOTE — Progress Notes (Signed)
Pre visit review using our clinic review tool, if applicable. No additional management support is needed unless otherwise documented below in the visit note. 

## 2016-09-26 NOTE — Assessment & Plan Note (Addendum)
Td -2015  Had a flu shot ~07-2016 Colon cancer screening: cscope 10-2015, next per GI  Prostate cancer screening: last year PSA was elevated, decreased w/ Abx, recheck   Diet -exercise: Discussed. Labs:   BMP, AST, ALT, FLP, PSA

## 2016-09-27 NOTE — Assessment & Plan Note (Signed)
Increased PSA: After he was treated with antibiotics, PSA came back normal, recheck  today. He is asx HTN: Continue lisinopril, no ambulatory BPs, BPs here are all normal. RTC one year.

## 2016-11-20 ENCOUNTER — Telehealth: Payer: Self-pay | Admitting: Internal Medicine

## 2016-11-20 MED ORDER — LISINOPRIL 40 MG PO TABS: 40.0000 mg | ORAL_TABLET | Freq: Every day | ORAL | 3 refills | Status: DC

## 2016-11-20 MED ORDER — PAROXETINE HCL 20 MG PO TABS: 20.0000 mg | ORAL_TABLET | Freq: Every day | ORAL | 3 refills | Status: DC

## 2016-11-20 NOTE — Telephone Encounter (Signed)
Rxs sent

## 2016-11-20 NOTE — Telephone Encounter (Signed)
Patient is calling to request a new prescription of PARoxetine (PAXIL) 20 MG tablet and lisinopril (PRINIVIL,ZESTRIL) 40 MG tablet be sent to the patient's new mail order pharmacy. Please advise  Pharmacy: [place] Fax: [phone]

## 2017-07-27 ENCOUNTER — Ambulatory Visit (INDEPENDENT_AMBULATORY_CARE_PROVIDER_SITE_OTHER): Payer: BLUE CROSS/BLUE SHIELD | Admitting: Internal Medicine

## 2017-07-27 ENCOUNTER — Encounter: Payer: Self-pay | Admitting: Internal Medicine

## 2017-07-27 VITALS — BP 128/78 | HR 68 | Temp 97.8°F | Resp 14 | Ht 72.0 in | Wt 246.5 lb

## 2017-07-27 DIAGNOSIS — L03119 Cellulitis of unspecified part of limb: Secondary | ICD-10-CM

## 2017-07-27 MED ORDER — DOXYCYCLINE HYCLATE 100 MG PO TABS: 100.0000 mg | ORAL_TABLET | Freq: Two times a day (BID) | ORAL | 0 refills | Status: DC

## 2017-07-27 MED FILL — DOXYCYCLINE HYCLATE 100 MG: 100 | 8 days supply | Qty: 15 | Fill #0

## 2017-07-27 NOTE — Patient Instructions (Signed)
Call if no better

## 2017-07-27 NOTE — Progress Notes (Signed)
Pre visit review using our clinic review tool, if applicable. No additional management support is needed unless otherwise documented below in the visit note. 

## 2017-07-27 NOTE — Progress Notes (Signed)
   Subjective:    Patient ID: Joel Hutchinson, male    DOB: [birth year], 51 y.o.   MRN: [id number]  DOS:  07/27/2017 Type of visit - description : acute Interval history:  A week ago noted left pretibial area with redness and some swelling, since then the swelling is on and off. Does not recall any actual injury.  Review of Systems No fever chills No discharge  Past Medical History:  Diagnosis Date  . Anxiety   . Arthralgia   . Diverticulitis 06-2014  . HTN (hypertension)   . Prediabetes 09/15/2014    Past Surgical History:  Procedure Laterality Date  . CHOLECYSTECTOMY N/A 04/09/2014   Procedure: LAPAROSCOPIC CHOLECYSTECTOMY;  Surgeon: Rolm Bookbinder, MD;  Location: [place];  Service: General;  Laterality: N/A;  . NASAL SINUS SURGERY  1988    Social History   Social History  . Marital status: Married    Spouse name: N/A  . Number of children: 0  . 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 Smokeless tobacco: Never Used     Comment: quit at age 29  . Alcohol use No  . Drug use: No  . Sexual activity: Not on file   Other Topics Concern  . Not on file   Social History Narrative   Household-- pt and wife      Allergies as of 07/27/2017   No Known Allergies     Medication List       Accurate as of 07/27/17  1:19 PM. Always use your most recent med list.          acetaminophen 500 MG tablet Commonly known as:  TYLENOL Take 1,000 mg by mouth every 6 (six) hours as needed for mild pain.   lisinopril 40 MG tablet Commonly known as:  PRINIVIL,ZESTRIL Take 1 tablet (40 mg total) by mouth daily.   PARoxetine 20 MG tablet Commonly known as:  PAXIL Take 1 tablet (20 mg total) by mouth daily.          Objective:   Physical Exam BP 128/78 (BP Location: Left Arm, Patient Position: Sitting, Cuff Size: Normal)   Pulse 68   Temp 97.8 F (36.6 C) (Oral)    Resp 14   Ht 6' (1.829 m)   Wt 246 lb 8 oz (111.8 kg)   SpO2 96%   BMI 33.43 kg/m  General:   Well developed, well nourished . NAD.  HEENT:  Normocephalic . Face symmetric, atraumatic  Skin: see picture, +  redness, swelling, slightly TTP located at the left pretibial area. No fluctuance. Neurologic:  alert & oriented X3.  Speech normal, gait appropriate for age and unassisted Psych--  Cognition and judgment appear intact.  Cooperative with normal attention span and concentration.  Behavior appropriate. No anxious or depressed appearing.        Assessment & Plan:  Assessment> Prediabetes HTN Anxiety, h/o panic attacks    Plan: Cellulitis: Suspect that area of bacterial infection, he did not have any injury that he recalls. Will treat with doxycycline, eyes, leg elevation. Call if no better. Avoid excessive sun while on abx. Patient verbalized understanding.

## 2017-07-28 NOTE — Assessment & Plan Note (Signed)
Cellulitis: Suspect that area of bacterial infection, he did not have any injury that he recalls. Will treat with doxycycline, eyes, leg elevation. Call if no better. Avoid excessive sun while on abx. Patient verbalized understanding.

## 2017-10-01 ENCOUNTER — Ambulatory Visit (INDEPENDENT_AMBULATORY_CARE_PROVIDER_SITE_OTHER): Payer: BLUE CROSS/BLUE SHIELD | Admitting: Internal Medicine

## 2017-10-01 ENCOUNTER — Encounter: Payer: Self-pay | Admitting: Internal Medicine

## 2017-10-01 VITALS — BP 118/80 | HR 75 | Temp 98.5°F | Resp 14 | Ht 72.0 in | Wt 245.0 lb

## 2017-10-01 DIAGNOSIS — R739 Hyperglycemia, unspecified: Secondary | ICD-10-CM | POA: Diagnosis not present

## 2017-10-01 DIAGNOSIS — Z0000 Encounter for general adult medical examination without abnormal findings: Secondary | ICD-10-CM

## 2017-10-01 LAB — COMPREHENSIVE METABOLIC PANEL
ALT: 13 U/L (ref 0–53)
AST: 14 U/L (ref 0–37)
Albumin: 4.4 g/dL (ref 3.5–5.2)
Alkaline Phosphatase: 48 U/L (ref 39–117)
BILIRUBIN TOTAL: 0.8 mg/dL (ref 0.2–1.2)
BUN: 14 mg/dL (ref 6–23)
CALCIUM: 8.9 mg/dL (ref 8.4–10.5)
CO2: 29 meq/L (ref 19–32)
Chloride: 105 mEq/L (ref 96–112)
Creatinine, Ser: 0.87 mg/dL (ref 0.40–1.50)
GFR: 97.88 mL/min (ref >60.00)
Glucose, Bld: 114 mg/dL — ABNORMAL HIGH (ref 70–99)
Potassium: 4.1 mEq/L (ref 3.5–5.1)
Sodium: 140 mEq/L (ref 135–145)
Total Protein: 6.8 g/dL (ref 6.0–8.3)

## 2017-10-01 LAB — CBC WITH DIFFERENTIAL/PLATELET
BASOS ABS: 0 10*3/uL (ref 0.0–0.1)
BASOS PCT: 0.4 % (ref 0.0–3.0)
EOS ABS: 0.1 10*3/uL (ref 0.0–0.7)
Eosinophils Relative: 1 % (ref 0.0–5.0)
HEMATOCRIT: 46.4 % (ref 39.0–52.0)
HEMOGLOBIN: 15.6 g/dL (ref 13.0–17.0)
Lymphocytes Relative: 36.4 % (ref 12.0–46.0)
Lymphs Abs: 2.1 10*3/uL (ref 0.7–4.0)
MCHC: 33.6 g/dL (ref 30.0–36.0)
MCV: 91.5 fl (ref 78.0–100.0)
MONO ABS: 0.4 10*3/uL (ref 0.1–1.0)
Monocytes Relative: 7.3 % (ref 3.0–12.0)
NEUTROS ABS: 3.1 10*3/uL (ref 1.4–7.7)
Neutrophils Relative %: 54.9 % (ref 43.0–77.0)
PLATELETS: 193 10*3/uL (ref 150.0–400.0)
RBC: 5.07 Mil/uL (ref 4.22–5.81)
RDW: 13 % (ref 11.5–15.5)
WBC: 5.7 10*3/uL (ref 4.0–10.5)

## 2017-10-01 LAB — LIPID PANEL
Cholesterol: 166 mg/dL (ref 0–200)
HDL: 39.5 mg/dL (ref >39.00)
LDL Cholesterol: 110 mg/dL — ABNORMAL HIGH (ref 0–99)
NonHDL: 126.84
Total CHOL/HDL Ratio: 4
Triglycerides: 82 mg/dL (ref 0.0–149.0)
VLDL: 16.4 mg/dL (ref 0.0–40.0)

## 2017-10-01 LAB — PSA: PSA: 1.36 ng/mL (ref 0.10–4.00)

## 2017-10-01 LAB — HEMOGLOBIN A1C: HEMOGLOBIN A1C: 6.1 % (ref 4.6–6.5)

## 2017-10-01 NOTE — Progress Notes (Signed)
Pre visit review using our clinic review tool, if applicable. No additional management support is needed unless otherwise documented below in the visit note. 

## 2017-10-01 NOTE — Assessment & Plan Note (Signed)
-  Td 2015 ; had a flu shot   -Colon cancer screening: cscope 10-2015, next per GI -Prostate cancer screening: DRE today normal, checking a PSA.  No GU symptoms Diet -exercise: Discussed. Labs: CMP, FLP, CBC, A1c, PSA

## 2017-10-01 NOTE — Progress Notes (Signed)
Subjective:    Patient ID: Joel Hutchinson, male    DOB: 1966/08/21, 51 y.o.   MRN: [id number]  DOS:  10/01/2017 Type of visit - description : cpx Interval history: No concerns   Review of Systems  A 14 point review of systems is negative    Past Medical History:  Diagnosis Date  . Anxiety   . Arthralgia   . Diverticulitis 06-2014  . HTN (hypertension)   . Prediabetes 09/15/2014    Past Surgical History:  Procedure Laterality Date  . CHOLECYSTECTOMY N/A 04/09/2014   Procedure: LAPAROSCOPIC CHOLECYSTECTOMY;  Surgeon: Rolm Bookbinder, MD;  Location: [place];  Service: General;  Laterality: N/A;  . NASAL SINUS SURGERY  1988    Social History   Socioeconomic History  . Marital status: Married    Spouse name: Not on file  . Number of children: 0  . Years of education: Not on file  . Highest education level: Not on file  Social Needs  . Financial resource strain: Not on file  . Food insecurity - worry: Not on file  . Food insecurity - inability: Not on file  . Transportation needs - medical: Not on file  . Transportation needs - non-medical: Not on file  Occupational History  . Occupation: truck Geophysicist/field seismologist  Tobacco Use  . Smoking status: Former Research scientist (life sciences)  . Smokeless tobacco: Never Used  . Tobacco comment: quit at age 57  Substance and Sexual Activity  . Alcohol use: No    Alcohol/week: 0.0 oz  . Drug use: No  . Sexual activity: Not on file  Other Topics Concern  . Not on file  Social History Narrative   Household-- pt and wife     Family History  Problem Relation Age of Onset  . CAD Father        MI age 32, died few weeks later   . Diabetes Father        father, bro, GP  . Stroke Other        GM  . Atrial fibrillation Mother   . Colon cancer Neg Hx   . Prostate cancer Neg Hx      Allergies as of 10/01/2017   No Known Allergies     Medication List        Accurate as of 10/01/17  5:29 PM. Always use your most recent med list.            acetaminophen 500 MG tablet Commonly known as:  TYLENOL Take 1,000 mg by mouth every 6 (six) hours as needed for mild pain.   lisinopril 40 MG tablet Commonly known as:  PRINIVIL,ZESTRIL Take 1 tablet (40 mg total) by mouth daily.   PARoxetine 20 MG tablet Commonly known as:  PAXIL Take 1 tablet (20 mg total) by mouth daily.          Objective:   Physical Exam BP 118/80 (BP Location: Left Arm, Patient Position: Sitting, Cuff Size: Normal)   Pulse 75   Temp 98.5 F (36.9 C) (Oral)   Resp 14   Ht 6' (1.829 m)   Wt 245 lb (111.1 kg)   SpO2 98%   BMI 33.23 kg/m  General:   Well developed, well nourished . NAD.  Neck: No  thyromegaly  HEENT:  Normocephalic . Face symmetric, atraumatic Lungs:  CTA B Normal respiratory effort, no intercostal retractions, no accessory muscle use. Heart: RRR,  no murmur.  No pretibial edema bilaterally  Abdomen:  Not distended, soft,  non-tender. No rebound or rigidity.   Skin: Exposed areas without rash. Not pale. Not jaundice Rectal:  External abnormalities: none. Normal sphincter tone. No rectal masses or tenderness.  Stool brown  Prostate: Prostate gland firm and smooth, no enlargement, nodularity, tenderness, mass, asymmetry or induration.  Neurologic:  alert & oriented X3.  Speech normal, gait appropriate for age and unassisted Strength symmetric and appropriate for age.  Psych: Cognition and judgment appear intact.  Cooperative with normal attention span and concentration.  Behavior appropriate. No anxious or depressed appearing.     Assessment & Plan:   Assessment Prediabetes HTN Anxiety, h/o panic attacks    Plan: Cellulitis: See last visit, resolved Prediabetes: Checking labs HTN: on lisinopril, checking labs, recommend ambulatory BPs RTC 1 year

## 2017-10-01 NOTE — Assessment & Plan Note (Signed)
Cellulitis: See last visit, resolved Prediabetes: Checking labs HTN: on lisinopril, checking labs, recommend ambulatory BPs RTC 1 year

## 2017-10-01 NOTE — Patient Instructions (Signed)
GO TO THE LAB : Get the blood work     GO TO THE FRONT DESK Schedule your next appointment for a physical exam in 1 year    Check the  blood pressure  Monthly  Be sure your blood pressure is between 110/65 and  135/85. If it is consistently higher or lower, let me know

## 2017-12-07 ENCOUNTER — Telehealth: Payer: Self-pay | Admitting: Internal Medicine

## 2017-12-07 MED ORDER — LISINOPRIL 40 MG PO TABS: 40.0000 mg | ORAL_TABLET | Freq: Every day | ORAL | 3 refills | Status: DC

## 2017-12-07 MED ORDER — PAROXETINE HCL 20 MG PO TABS: 20.0000 mg | ORAL_TABLET | Freq: Every day | ORAL | 3 refills | Status: DC

## 2017-12-07 NOTE — Telephone Encounter (Signed)
Copied from [place]. Topic: General - Other >> Dec 07, 2017 11:16 AM Neva Seat wrote: Lisinopril 40 mg Paroxetine 20 mg  1 year supply Pt is almost out -  needing the above called in ASAP to:  [facility], [place] to [address] [place] [address] Phone: [phone] Fax: [phone]

## 2017-12-07 NOTE — Telephone Encounter (Signed)
Rxs sent

## 2018-10-06 ENCOUNTER — Encounter: Payer: Self-pay | Admitting: Internal Medicine

## 2018-10-06 ENCOUNTER — Ambulatory Visit (INDEPENDENT_AMBULATORY_CARE_PROVIDER_SITE_OTHER): Payer: BLUE CROSS/BLUE SHIELD | Admitting: Internal Medicine

## 2018-10-06 VITALS — BP 116/76 | HR 62 | Temp 98.4°F | Resp 16 | Ht 72.0 in | Wt 244.4 lb

## 2018-10-06 DIAGNOSIS — M545 Low back pain, unspecified: Secondary | ICD-10-CM

## 2018-10-06 DIAGNOSIS — Z0000 Encounter for general adult medical examination without abnormal findings: Secondary | ICD-10-CM

## 2018-10-06 DIAGNOSIS — R739 Hyperglycemia, unspecified: Secondary | ICD-10-CM | POA: Diagnosis not present

## 2018-10-06 DIAGNOSIS — M25512 Pain in left shoulder: Secondary | ICD-10-CM

## 2018-10-06 DIAGNOSIS — M25511 Pain in right shoulder: Secondary | ICD-10-CM | POA: Diagnosis not present

## 2018-10-06 LAB — CBC WITH DIFFERENTIAL/PLATELET
Basophils Absolute: 0 10*3/uL (ref 0.0–0.1)
Basophils Relative: 0.7 % (ref 0.0–3.0)
EOS ABS: 0.1 10*3/uL (ref 0.0–0.7)
Eosinophils Relative: 1 % (ref 0.0–5.0)
HEMATOCRIT: 46.4 % (ref 39.0–52.0)
HEMOGLOBIN: 16 g/dL (ref 13.0–17.0)
LYMPHS ABS: 2.2 10*3/uL (ref 0.7–4.0)
LYMPHS PCT: 41.1 % (ref 12.0–46.0)
MCHC: 34.5 g/dL (ref 30.0–36.0)
MCV: 88.8 fl (ref 78.0–100.0)
Monocytes Absolute: 0.5 10*3/uL (ref 0.1–1.0)
Monocytes Relative: 9 % (ref 3.0–12.0)
Neutro Abs: 2.6 10*3/uL (ref 1.4–7.7)
Neutrophils Relative %: 48.2 % (ref 43.0–77.0)
PLATELETS: 189 10*3/uL (ref 150.0–400.0)
RBC: 5.22 Mil/uL (ref 4.22–5.81)
RDW: 12.7 % (ref 11.5–15.5)
WBC: 5.3 10*3/uL (ref 4.0–10.5)

## 2018-10-06 LAB — LIPID PANEL
Cholesterol: 161 mg/dL (ref 0–200)
HDL: 41.4 mg/dL (ref >39.00)
LDL Cholesterol: 105 mg/dL — ABNORMAL HIGH (ref 0–99)
NonHDL: 119.24
Total CHOL/HDL Ratio: 4
Triglycerides: 71 mg/dL (ref 0.0–149.0)
VLDL: 14.2 mg/dL (ref 0.0–40.0)

## 2018-10-06 LAB — COMPREHENSIVE METABOLIC PANEL
ALT: 14 U/L (ref 0–53)
AST: 16 U/L (ref 0–37)
Albumin: 4.4 g/dL (ref 3.5–5.2)
Alkaline Phosphatase: 45 U/L (ref 39–117)
BUN: 15 mg/dL (ref 6–23)
CALCIUM: 9.2 mg/dL (ref 8.4–10.5)
CO2: 30 meq/L (ref 19–32)
Chloride: 103 mEq/L (ref 96–112)
Creatinine, Ser: 0.99 mg/dL (ref 0.40–1.50)
GFR: 83.99 mL/min (ref >60.00)
Glucose, Bld: 108 mg/dL — ABNORMAL HIGH (ref 70–99)
POTASSIUM: 4.9 meq/L (ref 3.5–5.1)
SODIUM: 138 meq/L (ref 135–145)
Total Bilirubin: 0.9 mg/dL (ref 0.2–1.2)
Total Protein: 7 g/dL (ref 6.0–8.3)

## 2018-10-06 LAB — HEMOGLOBIN A1C: HEMOGLOBIN A1C: 6 % (ref 4.6–6.5)

## 2018-10-06 LAB — TSH: TSH: 1.47 u[IU]/mL (ref 0.35–4.50)

## 2018-10-06 NOTE — Progress Notes (Signed)
Pre visit review using our clinic review tool, if applicable. No additional management support is needed unless otherwise documented below in the visit note. 

## 2018-10-06 NOTE — Progress Notes (Signed)
Subjective:    Patient ID: Joel Hutchinson, male    DOB: 12-17-1965, 52 y.o.   MRN: [id number]  DOS:  10/06/2018 Type of visit - description : CPX Has few concerns  Review of Systems Reports chronic back pain on and off for years, also shoulders and knee pains with moving in and out of his track.  He has a physical job. Denies fever chills.  No headaches or weight loss.  No joint puffiness or rash.  Other than above, a 14 point review of systems is negative    Past Medical History:  Diagnosis Date  . Anxiety   . Arthralgia   . Diverticulitis 06-2014  . HTN (hypertension)   . Prediabetes 09/15/2014    Past Surgical History:  Procedure Laterality Date  . CHOLECYSTECTOMY N/A 04/09/2014   Procedure: LAPAROSCOPIC CHOLECYSTECTOMY;  Surgeon: Rolm Bookbinder, MD;  Location: [place];  Service: General;  Laterality: N/A;  . NASAL SINUS SURGERY  1988    Social History   Socioeconomic History  . Marital status: Married    Spouse name: Not on file  . Number of children: 0  . Years of education: Not on file  . Highest education level: Not on file  Occupational History  . Occupation: truck Animator Needs  . Financial resource strain: Not on file  . Food insecurity:    Worry: Not on file    Inability: Not on file  . Transportation needs:    Medical: Not on file    Non-medical: Not on file  Tobacco Use  . Smoking status: Former Research scientist (life sciences)  . Smokeless tobacco: Never Used  . Tobacco comment: quit at age 74  Substance and Sexual Activity  . Alcohol use: No    Alcohol/week: 0.0 standard drinks  . Drug use: No  . Sexual activity: Not on file  Lifestyle  . Physical activity:    Days per week: Not on file    Minutes per session: Not on file  . Stress: Not on file  Relationships  . Social connections:    Talks on phone: Not on file    Gets together: Not on file    Attends religious service: Not on file    Active member of club or organization: Not on file    Attends  meetings of clubs or organizations: Not on file    Relationship status: Not on file  . Intimate partner violence:    Fear of current or ex partner: Not on file    Emotionally abused: Not on file    Physically abused: Not on file    Forced sexual activity: Not on file  Other Topics Concern  . Not on file  Social History Narrative   Household-- pt and wife   Family History  Problem Relation Age of Onset  . CAD Father        MI age 25, died few weeks later   . Diabetes Father        father, bro, GP  . Stroke Other        GM  . Atrial fibrillation Mother   . Colon cancer Neg Hx   . Prostate cancer Neg Hx      Allergies as of 10/06/2018   No Known Allergies     Medication List        Accurate as of 10/06/18  6:35 PM. Always use your most recent med list.          acetaminophen 500  MG tablet Commonly known as:  TYLENOL Take 1,000 mg by mouth every 6 (six) hours as needed for mild pain.   lisinopril 40 MG tablet Commonly known as:  PRINIVIL,ZESTRIL Take 1 tablet (40 mg total) by mouth daily.   PARoxetine 20 MG tablet Commonly known as:  PAXIL Take 1 tablet (20 mg total) by mouth daily.           Objective:   Physical Exam BP 116/76 (BP Location: Left Arm, Patient Position: Sitting, Cuff Size: Normal)   Pulse 62   Temp 98.4 F (36.9 C) (Oral)   Resp 16   Ht 6' (1.829 m)   Wt 244 lb 6 oz (110.8 kg)   SpO2 96%   BMI 33.14 kg/m  General: Well developed, NAD, BMI noted Neck: No  thyromegaly  HEENT:  Normocephalic . Face symmetric, atraumatic Lungs:  CTA B Normal respiratory effort, no intercostal retractions, no accessory muscle use. Heart: RRR,  no murmur.  No pretibial edema bilaterally  Abdomen:  Not distended, soft, non-tender. No rebound or rigidity. MSK: No puffiness of the hands or wrists Skin: Exposed areas without rash. Not pale. Not jaundice Neurologic:  alert & oriented X3.  Speech normal, gait appropriate for age and  unassisted Strength symmetric and appropriate for age.  Psych: Cognition and judgment appear intact.  Cooperative with normal attention span and concentration.  Behavior appropriate. No anxious or depressed appearing.     Assessment & Plan:    Assessment Prediabetes HTN DJD Anxiety, h/o panic attacks  h/o diverticulitis  PLAN Prediabetes: Check A1c HTN: Continue lisinopril, check ambulatory BPs. DJD: Symptoms as described above sound classic for DJD.  Recommend judicious use of Tylenol and ibuprofen.  Will refer to physical therapy, the patient feels that he is very stiff and thinks PT for his upper and lower back should be helpful.  I completely agree.  Recommend to remain active. RTC 1 year

## 2018-10-06 NOTE — Patient Instructions (Signed)
GO TO THE LAB : Get the blood work     GO TO THE FRONT DESK Schedule your next appointment for a   Physical in 1 year   Check the  blood pressure   monthly   Be sure your blood pressure is between 110/65 and  135/85. If it is consistently higher or lower, let me know

## 2018-10-06 NOTE — Assessment & Plan Note (Signed)
-  Td 2015 ; had a flu shot   -Colon cancer screening: cscope 10-2015, next per GI -Prostate cancer screening: DRE   PSA 2018 wnl - labs: CMP, FLP, CBC, A1c, TSH -Diet and exercise discussed -10-year cardiovascular risk: 4.5 %.   He is in the lower spectrum of qualify for statins, I discussed with the patient, he will think about the issue.

## 2018-10-06 NOTE — Assessment & Plan Note (Signed)
Prediabetes: Check A1c HTN: Continue lisinopril, check ambulatory BPs. DJD: Symptoms as described above sound classic for DJD.  Recommend judicious use of Tylenol and ibuprofen.  Will refer to physical therapy, the patient feels that he is very stiff and thinks PT for his upper and lower back should be helpful.  I completely agree.  Recommend to remain active. RTC 1 year

## 2018-11-02 ENCOUNTER — Ambulatory Visit (INDEPENDENT_AMBULATORY_CARE_PROVIDER_SITE_OTHER): Payer: BLUE CROSS/BLUE SHIELD | Admitting: Physical Therapy

## 2018-11-02 ENCOUNTER — Encounter: Payer: Self-pay | Admitting: Physical Therapy

## 2018-11-02 DIAGNOSIS — M545 Low back pain: Secondary | ICD-10-CM | POA: Diagnosis not present

## 2018-11-02 DIAGNOSIS — M25511 Pain in right shoulder: Secondary | ICD-10-CM | POA: Diagnosis not present

## 2018-11-02 DIAGNOSIS — G8929 Other chronic pain: Secondary | ICD-10-CM | POA: Diagnosis not present

## 2018-11-02 NOTE — Patient Instructions (Signed)
Access Code: [email or web address]  URL: [email or web address]  Date: 11/02/2018  Prepared by: Lyndee Hensen   Exercises  Single Knee to Chest Stretch - 3 reps - 30 hold - 2x daily  Supine Lower Trunk Rotation - 10 reps - 5 hold - 2x daily  Supine Posterior Pelvic Tilt - 10 reps - 2 sets - 2x daily  Seated Hamstring Stretch - 3 reps - 30 hold - 2x daily

## 2018-11-03 NOTE — Therapy (Signed)
[place] [place] [address] [place], [place], [address] Phone: [phone]   Fax:  [phone]  Physical Therapy Evaluation  Patient Details  Name: Joel Hutchinson MRN: [id number] Date of Birth: [birth year] Referring Provider (PT): Kathlene November   Encounter Date: 11/02/2018  PT End of Session - 11/03/18 0849    Visit Number  1    Number of Visits  12    Date for PT Re-Evaluation  12/14/18    Authorization Type  BCBS    PT Start Time  1516    PT Stop Time  1558    PT Time Calculation (min)  42 min    Activity Tolerance  Patient tolerated treatment well    Behavior During Therapy  [facility] for tasks assessed/performed       Past Medical History:  Diagnosis Date  . Anxiety   . Arthralgia   . Diverticulitis 06-2014  . HTN (hypertension)   . Prediabetes 09/15/2014    Past Surgical History:  Procedure Laterality Date  . CHOLECYSTECTOMY N/A 04/09/2014   Procedure: LAPAROSCOPIC CHOLECYSTECTOMY;  Surgeon: Rolm Bookbinder, MD;  Location: [place];  Service: General;  Laterality: N/A;  . NASAL SINUS SURGERY  1988    There were no vitals filed for this visit.   Subjective Assessment - 11/02/18 1525    Subjective  Pt states back pain for 30 years, no incident to report.  He works full time, driving truck. 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 Pt states stiffness and lack of mobility in back is more bothersome than pain. He does state R shoulder pain , for several years also. He would like to focus on back pain at this time.     Limitations  Sitting;Lifting;Standing;Walking;House hold activities    Patient Stated Goals  Decrease pain, improved movement.     Currently in Pain?  Yes    Pain Score  5     Pain Location  Back    Pain Orientation  Right;Left    Pain Descriptors / Indicators  Aching    Pain Type  Chronic pain    Pain Onset  More than a month ago    Pain Frequency  Intermittent    Aggravating Factors   AM, bending,  lifting.     Pain Relieving Factors  tylenol    Multiple Pain Sites  Yes    Pain Score  4    Pain Location  Shoulder    Pain Orientation  Right    Pain Descriptors / Indicators  Aching    Pain Type  Chronic pain    Pain Onset  More than a month ago    Pain Frequency  Intermittent         [facility] PT Assessment - 11/02/18 1542      Assessment   Medical Diagnosis  Back Pain/ R shoulder Pain    Referring Provider (PT)  Jose Paz    Hand Dominance  Right    Prior Therapy  no      Precautions   Precautions  None      Balance Screen   Has the patient fallen in the past 6 months  No      Prior Function   Level of Independence  Independent with basic ADLs      Cognition   Overall Cognitive Status  Within Functional Limits for tasks assessed      Posture/Postural Control   Posture Comments  Sitting: poor posture, PPT, rounded shoulders;  ROM / Strength   AROM / PROM / Strength  AROM;Strength      AROM   Overall AROM Comments  Lumbar: Flexion: moderate deficit, extension: moderate deficit;  SB: mild deficit;  Rotation: moderate deficit;    R shoulder: AROM: flex: 140; Rotation: WFL;       Strength   Overall Strength Comments  Hips: 4-/5;  Core: poor stability with bridge exercise, inability for ppt or TA;   Shoulder: R: 4/5      Palpation   Palpation comment  Pain at Bil SI, miimal pain in glutes, Mild pain in central lumbar region, and into lateral musculature.       Special Tests   Other special tests  Neg SLR bil;                 Objective measurements completed on examination: See above findings.      [facility] PT Treatment/Exercise - 11/03/18 0001      Exercises   Exercises  Lumbar      Lumbar Exercises: Stretches   Active Hamstring Stretch  3 reps;30 seconds    Active Hamstring Stretch Limitations  seated    Single Knee to Chest Stretch  3 reps;30 seconds    Lower Trunk Rotation  5 reps;10 seconds    Pelvic Tilt  20 reps;5 seconds              PT Education - 11/02/18 1634    Education Details  PT POC, HEP    Person(s) Educated  Patient    Methods  Explanation;Demonstration;Handout    Comprehension  Verbalized understanding;Verbal cues required;Returned demonstration;Need further instruction       PT Short Term Goals - 11/03/18 0852      PT SHORT TERM GOAL #1   Title  Pt to be independent with initial HEP     Time  2    Period  Weeks    Status  New    Target Date  11/16/18      PT SHORT TERM GOAL #2   Title  Pt to demo ability to self correct seated posture in clinic at least 75% of the time    Time  2    Period  Weeks    Status  New    Target Date  11/16/18        PT Long Term Goals - 11/03/18 0854      PT LONG TERM GOAL #1   Title  Pt to report decreased pain in low back to 0-3/10 with activity and bending.     Time  6    Period  Weeks    Status  New    Target Date  12/14/18      PT LONG TERM GOAL #2   Title  Pt to demo improved lumbar ROM to be [facility], to improve ability for bending, squatting, IADLS and work duties.     Time  6    Period  Weeks    Status  New    Target Date  12/14/18      PT LONG TERM GOAL #3   Title  Pt to demo improved strength of hips to be at least 4+/5, and core to be [facility] , with minimal postural changes with stabilization exercises, to improve stability, posture, and pain    Time  6    Period  Weeks    Status  New    Target Date  12/14/18  PT LONG TERM GOAL #4   Title  Pt to be independent with HEP for ROM and strength of R shoulder    Time  6    Period  Weeks    Status  New    Target Date  12/14/18             Plan - 11/03/18 0858    Clinical Impression Statement  Pt presents with primary complaint of increased pain in low back region. Pt with most pain and limitation with bending and lifting. Pt with poor seated posture, and poor movement mechanics with bending, squatting, and lifting. Pt with decreased strength of bil hips and core, with poor  ability for core stabilization. Pt with increased tightness in bil lumbar region, with pain into bil SI region. Pt with mild ROM deficits with R shoulder, and has poor movement mechanics of shoulder, with rounded shoulder and forward head posture. Pt with lack of effective HEP and education on shoulder pain, and will benefit from this. Shoulder will be treated as secondary diagnosis. Pt to benefit from skilled PT to improve pain, function, mobility, and ability for work duties.     Clinical Presentation  Stable    Clinical Decision Making  Low    Rehab Potential  Good    PT Frequency  2x / week    PT Duration  6 weeks    PT Treatment/Interventions  ADLs/Self Care Home Management;Cryotherapy;Electrical Stimulation;Ultrasound;Iontophoresis 4mg /ml Dexamethasone;Functional mobility training;Stair training;Gait training;Traction;Moist Heat;Therapeutic activities;Therapeutic exercise;Balance training;Neuromuscular re-education;Patient/family education;Passive range of motion;Manual techniques;Dry needling;Energy conservation;Taping;Spinal Manipulations;Joint Manipulations    PT Next Visit Plan  Progress HEP and ther ex, for back    Consulted and Agree with Plan of Care  Patient       Patient will benefit from skilled therapeutic intervention in order to improve the following deficits and impairments:  Pain, Improper body mechanics, Increased muscle spasms, Decreased mobility, Decreased activity tolerance, Decreased endurance, Decreased range of motion, Decreased strength, Impaired flexibility  Visit Diagnosis: Chronic bilateral low back pain without sciatica  Chronic right shoulder pain     Problem List Patient Active Problem List   Diagnosis Date Noted  . PCP NOTES >>>> 09/17/2015  . Annual physical exam 09/15/2014  . pcp notes---Prediabetes 09/15/2014  . LLQ abdominal pain 07/24/2014  . RBBB 06/18/2014  . *HTN     . *Anxiety   . DJD (degenerative joint disease)     Lyndee Hensen, PT,  DPT 9:08 AM  11/03/18    [facility] [place], [place], [address] Phone: [phone]   Fax:  [phone]  Name: Joel Hutchinson MRN: [id number] Date of Birth: 09/21/1966

## 2018-11-08 ENCOUNTER — Encounter: Payer: BLUE CROSS/BLUE SHIELD | Admitting: Physical Therapy

## 2018-11-11 ENCOUNTER — Encounter: Payer: BLUE CROSS/BLUE SHIELD | Admitting: Physical Therapy

## 2018-11-15 ENCOUNTER — Encounter: Payer: BLUE CROSS/BLUE SHIELD | Admitting: Physical Therapy

## 2018-11-17 ENCOUNTER — Encounter: Payer: BLUE CROSS/BLUE SHIELD | Admitting: Physical Therapy

## 2018-11-22 ENCOUNTER — Encounter: Payer: BLUE CROSS/BLUE SHIELD | Admitting: Physical Therapy

## 2018-11-24 ENCOUNTER — Other Ambulatory Visit: Payer: Self-pay | Admitting: Internal Medicine

## 2018-11-25 ENCOUNTER — Encounter: Payer: BLUE CROSS/BLUE SHIELD | Admitting: Physical Therapy

## 2018-11-29 ENCOUNTER — Encounter: Payer: BLUE CROSS/BLUE SHIELD | Admitting: Physical Therapy

## 2018-12-02 ENCOUNTER — Encounter: Payer: BLUE CROSS/BLUE SHIELD | Admitting: Physical Therapy

## 2019-10-11 ENCOUNTER — Other Ambulatory Visit: Payer: Self-pay

## 2019-10-12 ENCOUNTER — Encounter: Payer: Self-pay | Admitting: Internal Medicine

## 2019-10-12 ENCOUNTER — Ambulatory Visit (INDEPENDENT_AMBULATORY_CARE_PROVIDER_SITE_OTHER): Payer: BC Managed Care – PPO | Admitting: Internal Medicine

## 2019-10-12 VITALS — BP 118/70 | HR 70 | Temp 97.1°F | Resp 18 | Ht 72.0 in | Wt 247.0 lb

## 2019-10-12 DIAGNOSIS — I10 Essential (primary) hypertension: Secondary | ICD-10-CM | POA: Diagnosis not present

## 2019-10-12 DIAGNOSIS — Z0000 Encounter for general adult medical examination without abnormal findings: Secondary | ICD-10-CM

## 2019-10-12 DIAGNOSIS — Z0130 Encounter for examination of blood pressure without abnormal findings: Secondary | ICD-10-CM

## 2019-10-12 DIAGNOSIS — Z125 Encounter for screening for malignant neoplasm of prostate: Secondary | ICD-10-CM

## 2019-10-12 DIAGNOSIS — R739 Hyperglycemia, unspecified: Secondary | ICD-10-CM

## 2019-10-12 LAB — COMPREHENSIVE METABOLIC PANEL
ALT: 12 U/L (ref 0–53)
AST: 14 U/L (ref 0–37)
Albumin: 4.3 g/dL (ref 3.5–5.2)
Alkaline Phosphatase: 47 U/L (ref 39–117)
BUN: 12 mg/dL (ref 6–23)
CO2: 29 mEq/L (ref 19–32)
Calcium: 9.3 mg/dL (ref 8.4–10.5)
Chloride: 103 mEq/L (ref 96–112)
Creatinine, Ser: 1.02 mg/dL (ref 0.40–1.50)
GFR: 76.06 mL/min (ref >60.00)
Glucose, Bld: 111 mg/dL — ABNORMAL HIGH (ref 70–99)
Potassium: 5.1 mEq/L (ref 3.5–5.1)
Sodium: 139 mEq/L (ref 135–145)
Total Bilirubin: 0.7 mg/dL (ref 0.2–1.2)
Total Protein: 6.8 g/dL (ref 6.0–8.3)

## 2019-10-12 LAB — CBC WITH DIFFERENTIAL/PLATELET
Basophils Absolute: 0 10*3/uL (ref 0.0–0.1)
Basophils Relative: 0.8 % (ref 0.0–3.0)
Eosinophils Absolute: 0.1 10*3/uL (ref 0.0–0.7)
Eosinophils Relative: 1.1 % (ref 0.0–5.0)
HCT: 45.4 % (ref 39.0–52.0)
Hemoglobin: 15.4 g/dL (ref 13.0–17.0)
Lymphocytes Relative: 41.9 % (ref 12.0–46.0)
Lymphs Abs: 2.1 10*3/uL (ref 0.7–4.0)
MCHC: 34 g/dL (ref 30.0–36.0)
MCV: 90.1 fl (ref 78.0–100.0)
Monocytes Absolute: 0.4 10*3/uL (ref 0.1–1.0)
Monocytes Relative: 8 % (ref 3.0–12.0)
Neutro Abs: 2.5 10*3/uL (ref 1.4–7.7)
Neutrophils Relative %: 48.2 % (ref 43.0–77.0)
Platelets: 209 10*3/uL (ref 150.0–400.0)
RBC: 5.04 Mil/uL (ref 4.22–5.81)
RDW: 12.9 % (ref 11.5–15.5)
WBC: 5.1 10*3/uL (ref 4.0–10.5)

## 2019-10-12 LAB — LIPID PANEL
Cholesterol: 164 mg/dL (ref 0–200)
HDL: 38.5 mg/dL — ABNORMAL LOW (ref >39.00)
LDL Cholesterol: 110 mg/dL — ABNORMAL HIGH (ref 0–99)
NonHDL: 125.98
Total CHOL/HDL Ratio: 4
Triglycerides: 80 mg/dL (ref 0.0–149.0)
VLDL: 16 mg/dL (ref 0.0–40.0)

## 2019-10-12 LAB — HEMOGLOBIN A1C: Hgb A1c MFr Bld: 5.9 % (ref 4.6–6.5)

## 2019-10-12 LAB — TSH: TSH: 1.19 u[IU]/mL (ref 0.35–4.50)

## 2019-10-12 LAB — PSA: PSA: 0.71 ng/mL (ref 0.10–4.00)

## 2019-10-12 MED ORDER — PAROXETINE HCL 20 MG PO TABS: 20.0000 mg | ORAL_TABLET | Freq: Every day | ORAL | 3 refills | Status: DC

## 2019-10-12 MED ORDER — LISINOPRIL 40 MG PO TABS: 40.0000 mg | ORAL_TABLET | Freq: Every day | ORAL | 3 refills | Status: DC

## 2019-10-12 NOTE — Assessment & Plan Note (Signed)
Here for CPX Prediabetes: Check A1c HTN: On lisinopril, BPs when checked are okay. EKG today: NSR, RBBB, no new, no acute changes Anxiety: Controlled, on Paxil RTC 1 year

## 2019-10-12 NOTE — Assessment & Plan Note (Signed)
-  Td 2015 ; had a flu shot   -Colon cancer screening: cscope 10-2015, next 10 y per  per GI letter -Prostate cancer screening: DRE normal, check a PSA -Diet and exercise discussed -Labs:CMP, FLP, CBC, PSA, A1c, TSH

## 2019-10-12 NOTE — Patient Instructions (Signed)
GO TO THE LAB : Get the blood work     GO TO THE FRONT DESK Schedule your next appointment   for a physical exam in 1 year  Check your blood pressure monthly BP GOAL is between 110/65 and  135/85. If it is consistently higher or lower, let me know

## 2019-10-12 NOTE — Progress Notes (Signed)
Subjective:    Patient ID: Joel Hutchinson, male    DOB: 28-Oct-1965, 53 y.o.   MRN: [id number]  DOS:  10/12/2019 Type of visit - description: CPX No major concerns   Review of Systems Occasional diarrhea with certain foods, since he had his gallbladder removed  Other than above, a 14 point review of systems is negative    Past Medical History:  Diagnosis Date  . Anxiety   . Arthralgia   . Diverticulitis 06-2014  . HTN (hypertension)   . Prediabetes 09/15/2014    Past Surgical History:  Procedure Laterality Date  . CHOLECYSTECTOMY N/A 04/09/2014   Procedure: LAPAROSCOPIC CHOLECYSTECTOMY;  Surgeon: Rolm Bookbinder, MD;  Location: [place];  Service: General;  Laterality: N/A;  . NASAL SINUS SURGERY  1988    Social History   Socioeconomic History  . Marital status: Married    Spouse name: Not on file  . Number of children: 0  . Years of education: Not on file  . Highest education level: Not on file  Occupational History  . Occupation: truck Geophysicist/field seismologist  Tobacco Use  . Smoking status: Former Research scientist (life sciences)  . Smokeless tobacco: Never Used  . Tobacco comment: quit at age 19  Substance and Sexual Activity  . Alcohol use: No    Alcohol/week: 0.0 standard drinks  . Drug use: No  . Sexual activity: Not on file  Other Topics Concern  . Not on file  Social History Narrative   Household-- pt and wife   Social Determinants of Health   Financial Resource Strain:   . Difficulty of Paying Living Expenses: Not on file  Food Insecurity:   . Worried About Charity fundraiser in the Last Year: Not on file  . Ran Out of Food in the Last Year: Not on file  Transportation Needs:   . Lack of Transportation (Medical): Not on file  . Lack of Transportation (Non-Medical): Not on file  Physical Activity:   . Days of Exercise per Week: Not on file  . Minutes of Exercise per Session: Not on file  Stress:   . Feeling of Stress : Not on file  Social Connections:   . Frequency of  Communication with Friends and Family: Not on file  . Frequency of Social Gatherings with Friends and Family: Not on file  . Attends Religious Services: Not on file  . Active Member of Clubs or Organizations: Not on file  . Attends Archivist Meetings: Not on file  . Marital Status: Not on file  Intimate Partner Violence:   . Fear of Current or Ex-Partner: Not on file  . Emotionally Abused: Not on file  . Physically Abused: Not on file  . Sexually Abused: Not on file     Family History  Problem Relation Age of Onset  . CAD Father        MI age 30, died few weeks later   . Diabetes Father        father, bro, GP  . Stroke Other        GM  . Atrial fibrillation Mother   . Colon cancer Neg Hx   . Prostate cancer Neg Hx      Allergies as of 10/12/2019   No Known Allergies     Medication List       Accurate as of October 12, 2019  4:15 PM. If you have any questions, ask your nurse or doctor.  acetaminophen 500 MG tablet Commonly known as: TYLENOL Take 1,000 mg by mouth every 6 (six) hours as needed for mild pain.   lisinopril 40 MG tablet Commonly known as: ZESTRIL Take 1 tablet (40 mg total) by mouth daily.   PARoxetine 20 MG tablet Commonly known as: PAXIL Take 1 tablet (20 mg total) by mouth daily.           Objective:   Physical Exam BP 118/70 (BP Location: Right Arm, Patient Position: Sitting, Cuff Size: Large)   Pulse 70   Temp (!) 97.1 F (36.2 C) (Temporal)   Resp 18   Ht 6' (1.829 m)   Wt 247 lb (112 kg)   SpO2 96%   BMI 33.50 kg/m  General: Well developed, NAD, BMI noted Neck: No  thyromegaly  HEENT:  Normocephalic . Face symmetric, atraumatic Lungs:  CTA B Normal respiratory effort, no intercostal retractions, no accessory muscle use. Heart: RRR,  no murmur.  No pretibial edema bilaterally  Abdomen:  Not distended, soft, non-tender. No rebound or rigidity. DRE: Normal sphincter tone, no stools, prostate normal Skin:  Exposed areas without rash. Not pale. Not jaundice Neurologic:  alert & oriented X3.  Speech normal, gait appropriate for age and unassisted Strength symmetric and appropriate for age.  Psych: Cognition and judgment appear intact.  Cooperative with normal attention span and concentration.  Behavior appropriate. No anxious or depressed appearing.     Assessment     Assessment Prediabetes HTN DJD Anxiety, h/o panic attacks  h/o diverticulitis  PLAN Here for CPX Prediabetes: Check A1c HTN: On lisinopril, BPs when checked are okay. EKG today: NSR, RBBB, no new, no acute changes Anxiety: Controlled, on Paxil RTC 1 year   This visit occurred during the SARS-CoV-2 public health emergency.  Safety protocols were in place, including screening questions prior to the visit, additional usage of staff PPE, and extensive cleaning of exam room while observing appropriate contact time as indicated for disinfecting solutions.

## 2020-10-12 ENCOUNTER — Encounter: Payer: BC Managed Care – PPO | Admitting: Internal Medicine

## 2020-11-03 ENCOUNTER — Other Ambulatory Visit: Payer: Self-pay | Admitting: Internal Medicine

## 2020-12-06 ENCOUNTER — Other Ambulatory Visit: Payer: Self-pay

## 2020-12-06 ENCOUNTER — Ambulatory Visit (INDEPENDENT_AMBULATORY_CARE_PROVIDER_SITE_OTHER): Payer: BC Managed Care – PPO | Admitting: Internal Medicine

## 2020-12-06 ENCOUNTER — Encounter: Payer: Self-pay | Admitting: Internal Medicine

## 2020-12-06 VITALS — BP 135/85 | HR 63 | Temp 97.7°F | Ht 72.0 in | Wt 255.0 lb

## 2020-12-06 DIAGNOSIS — I10 Essential (primary) hypertension: Secondary | ICD-10-CM | POA: Diagnosis not present

## 2020-12-06 DIAGNOSIS — R739 Hyperglycemia, unspecified: Secondary | ICD-10-CM | POA: Diagnosis not present

## 2020-12-06 DIAGNOSIS — Z1159 Encounter for screening for other viral diseases: Secondary | ICD-10-CM | POA: Diagnosis not present

## 2020-12-06 DIAGNOSIS — Z0000 Encounter for general adult medical examination without abnormal findings: Secondary | ICD-10-CM | POA: Diagnosis not present

## 2020-12-06 LAB — COMPREHENSIVE METABOLIC PANEL
ALT: 13 U/L (ref 0–53)
AST: 13 U/L (ref 0–37)
Albumin: 4.5 g/dL (ref 3.5–5.2)
Alkaline Phosphatase: 44 U/L (ref 39–117)
BUN: 16 mg/dL (ref 6–23)
CO2: 30 mEq/L (ref 19–32)
Calcium: 9.3 mg/dL (ref 8.4–10.5)
Chloride: 102 mEq/L (ref 96–112)
Creatinine, Ser: 1.01 mg/dL (ref 0.40–1.50)
GFR: 83.82 mL/min (ref >60.00)
Glucose, Bld: 105 mg/dL — ABNORMAL HIGH (ref 70–99)
Potassium: 4.6 mEq/L (ref 3.5–5.1)
Sodium: 140 mEq/L (ref 135–145)
Total Bilirubin: 0.9 mg/dL (ref 0.2–1.2)
Total Protein: 7.3 g/dL (ref 6.0–8.3)

## 2020-12-06 LAB — HEMOGLOBIN A1C: Hgb A1c MFr Bld: 6.2 % (ref 4.6–6.5)

## 2020-12-06 LAB — CBC WITH DIFFERENTIAL/PLATELET
Basophils Absolute: 0.1 10*3/uL (ref 0.0–0.1)
Basophils Relative: 0.9 % (ref 0.0–3.0)
Eosinophils Absolute: 0 10*3/uL (ref 0.0–0.7)
Eosinophils Relative: 0.6 % (ref 0.0–5.0)
HCT: 46.7 % (ref 39.0–52.0)
Hemoglobin: 15.9 g/dL (ref 13.0–17.0)
Lymphocytes Relative: 36.8 % (ref 12.0–46.0)
Lymphs Abs: 2.3 10*3/uL (ref 0.7–4.0)
MCHC: 34.1 g/dL (ref 30.0–36.0)
MCV: 89.4 fl (ref 78.0–100.0)
Monocytes Absolute: 0.5 10*3/uL (ref 0.1–1.0)
Monocytes Relative: 7.9 % (ref 3.0–12.0)
Neutro Abs: 3.3 10*3/uL (ref 1.4–7.7)
Neutrophils Relative %: 53.8 % (ref 43.0–77.0)
Platelets: 213 10*3/uL (ref 150.0–400.0)
RBC: 5.22 Mil/uL (ref 4.22–5.81)
RDW: 13 % (ref 11.5–15.5)
WBC: 6.1 10*3/uL (ref 4.0–10.5)

## 2020-12-06 NOTE — Progress Notes (Signed)
   Subjective:    Patient ID: Joel Hutchinson, male    DOB: 03-02-1966, 55 y.o.   MRN: [id number]  DOS:  12/06/2020 Type of visit - description: CPX Since the last office visit he is doing well. Has noted weight gain, admits he has not been active outside work, room for improvement of his diet.  Wt Readings from Last 3 Encounters:  12/06/20 255 lb (115.7 kg)  10/12/19 247 lb (112 kg)  10/06/18 244 lb 6 oz (110.8 kg)     Review of Systems  Other than above, a 14 point review of systems is negative      Past Medical History:  Diagnosis Date  . Anxiety   . Arthralgia   . Diverticulitis 06-2014  . HTN (hypertension)   . Prediabetes 09/15/2014    Past Surgical History:  Procedure Laterality Date  . CHOLECYSTECTOMY N/A 04/09/2014   Procedure: LAPAROSCOPIC CHOLECYSTECTOMY;  Surgeon: Rolm Bookbinder, MD;  Location: [facility];  Service: General;  Laterality: N/A;  . NASAL SINUS SURGERY  1988    Allergies as of 12/06/2020   No Known Allergies     Medication List       Accurate as of December 06, 2020  9:16 PM. If you have any questions, ask your nurse or doctor.        acetaminophen 500 MG tablet Commonly known as: TYLENOL Take 1,000 mg by mouth every 6 (six) hours as needed for mild pain.   aspirin EC 81 MG tablet Take 81 mg by mouth daily. Swallow whole.   lisinopril 40 MG tablet Commonly known as: ZESTRIL Take 1 tablet (40 mg total) by mouth daily.   PARoxetine 20 MG tablet Commonly known as: PAXIL Take 1 tablet (20 mg total) by mouth daily.          Objective:   Physical Exam BP 135/85 (BP Location: Left Arm, Patient Position: Sitting, Cuff Size: Large)   Pulse 63   Temp 97.7 F (36.5 C) (Oral)   Ht 6' (1.829 m)   Wt 255 lb (115.7 kg)   SpO2 95%   BMI 34.58 kg/m  General: Well developed, NAD, BMI noted Neck: No  thyromegaly  HEENT:  Normocephalic . Face symmetric, atraumatic Lungs:  CTA B Normal respiratory effort, no intercostal  retractions, no accessory muscle use. Heart: RRR,  no murmur.  Abdomen:  Not distended, soft, non-tender. No rebound or rigidity.   Lower extremities: no pretibial edema bilaterally  Skin: Exposed areas without rash. Not pale. Not jaundice Neurologic:  alert & oriented X3.  Speech normal, gait appropriate for age and unassisted Strength symmetric and appropriate for age.  Psych: Cognition and judgment appear intact.  Cooperative with normal attention span and concentration.  Behavior appropriate. No anxious or depressed appearing.     Assessment    Assessment Prediabetes HTN DJD Anxiety, h/o panic attacks  h/o diverticulitis +FH father MI age 92  PLAN Here for CPX Prediabetes: Check A1c HTN: When BPs are checked they are okay, rec to check regularly, continue Zestril, check labs DJD: Well-controlled with occasional Tylenol and stretching. Anxiety: Controlled on Paxil FH CAD: Recommend to start aspirin 81 mg, watch for excessive bleeding. RTC 1 year  This visit occurred during the SARS-CoV-2 public health emergency.  Safety protocols were in place, including screening questions prior to the visit, additional usage of staff PPE, and extensive cleaning of exam room while observing appropriate contact time as indicated for disinfecting solutions.

## 2020-12-06 NOTE — Patient Instructions (Addendum)
Check the  blood pressure 2 or 3 times a month  BP GOAL is between 110/65 and  135/85. If it is consistently higher or lower, let me know  Consider vaccination against shingles called SHINGRIX  GO TO THE LAB : Get the blood work     [place], [place] back for   a physical exam in 1 year  Advance Directive  Advance directives are legal documents that allow you to make decisions about your health care and medical treatment in case you become unable to communicate for yourself. Advance directives let your wishes be known to family, friends, and health care providers. Discussing and writing advance directives should happen over time rather than all at once. Advance directives can be changed and updated at any time. There are different types of advance directives, such as:  Medical power of attorney.  Living will.  Do not resuscitate (DNR) order or do not attempt resuscitation (DNAR) order. Health care proxy and medical power of attorney A health care proxy is also called a health care agent. This person is appointed to make medical decisions for you when you are unable to make decisions for yourself. Generally, people ask a trusted friend or family member to act as their proxy and represent their preferences. Make sure you have an agreement with your trusted person to act as your proxy. A proxy may have to make a medical decision on your behalf if your wishes are not known. A medical power of attorney, also called a durable power of attorney for health care, is a legal document that names your health care proxy. Depending on the laws in your state, the document may need to be:  Signed.  Notarized.  Dated.  Copied.  Witnessed.  Incorporated into your medical record. You may also want to appoint a trusted person to manage your money in the event you are unable to do so. This is called a durable power of attorney for finances. It is a separate  legal document from the durable power of attorney for health care. You may choose your health care proxy or someone different to act as your agent in money matters. If you do not appoint a proxy, or there is a concern that the proxy is not acting in your best interest, a court may appoint a guardian to act on your behalf. Living will A living will is a set of instructions that state your wishes about medical care when you cannot express them yourself. Health care providers should keep a copy of your living will in your medical record. You may want to give a copy to family members or friends. To alert caregivers in case of an emergency, you can place a card in your wallet to let them know that you have a living will and where they can find it. A living will is used if you become:  Terminally ill.  Disabled.  Unable to communicate or make decisions. The following decisions should be included in your living will:  To use or not to use life support equipment, such as dialysis machines and breathing machines (ventilators).  Whether you want a DNR or DNAR order. This tells health care providers not to use cardiopulmonary resuscitation (CPR) if breathing or heartbeat stops.  To use or not to use tube feeding.  To be given or not to be given food and fluids.  Whether you want comfort (palliative) care when the goal becomes comfort  rather than a cure.  Whether you want to donate your organs and tissues. A living will does not give instructions for distributing your money and property if you should pass away. DNR or DNAR A DNR or DNAR order is a request not to have CPR in the event that your heart stops beating or you stop breathing. If a DNR or DNAR order has not been made and shared, a health care provider will try to help any patient whose heart has stopped or who has stopped breathing. If you plan to have surgery, talk with your health care provider about how your DNR or DNAR order will be  followed if problems occur. What if I do not have an advance directive? Some states assign family decision makers to act on your behalf if you do not have an advance directive. Each state has its own laws about advance directives. You may want to check with your health care provider, attorney, or state representative about the laws in your state. Summary  Advance directives are legal documents that allow you to make decisions about your health care and medical treatment in case you become unable to communicate for yourself.  The process of discussing and writing advance directives should happen over time. You can change and update advance directives at any time.  Advance directives may include a medical power of attorney, a living will, and a DNR or DNAR order. This information is not intended to replace advice given to you by your health care provider. Make sure you discuss any questions you have with your health care provider. Document Revised: 07/17/2020 Document Reviewed: 07/17/2020 Elsevier Patient Education  2021 [employer].

## 2020-12-06 NOTE — Assessment & Plan Note (Signed)
-  Td 2015 -COVID VAX x3 - had a flu shot   -Colon cancer screening: cscope 10-2015, next 10 y per  per GI letter -Prostate cancer screening: DRE  and PSA WNL 09/2019. -Patient education, discussed diet, exercise, advance directive. -Labs: CMP, CBC, A1c, hep C

## 2020-12-06 NOTE — Assessment & Plan Note (Signed)
Here for CPX Prediabetes: Check A1c HTN: When BPs are checked they are okay, rec to check regularly, continue Zestril, check labs DJD: Well-controlled with occasional Tylenol and stretching. Anxiety: Controlled on Paxil FH CAD: Recommend to start aspirin 81 mg, watch for excessive bleeding. RTC 1 year

## 2020-12-07 LAB — HEPATITIS C ANTIBODY
Hepatitis C Ab: NONREACTIVE
SIGNAL TO CUT-OFF: 0.01 (ref <1.00)

## 2021-03-11 ENCOUNTER — Telehealth: Payer: Self-pay | Admitting: Internal Medicine

## 2021-03-11 MED ORDER — LISINOPRIL 40 MG PO TABS: 40.0000 mg | ORAL_TABLET | Freq: Every day | ORAL | 3 refills | Status: DC

## 2021-03-11 MED ORDER — PAROXETINE HCL 20 MG PO TABS: 20.0000 mg | ORAL_TABLET | Freq: Every day | ORAL | 3 refills | Status: DC

## 2021-03-11 NOTE — Telephone Encounter (Signed)
Patient states Dr. Larose Kells normally fill his medication for one year. He will need refills on his medication in august   lisinopril (ZESTRIL) 40 MG tablet [[id number]]   PARoxetine (PAXIL) 20 MG tablet [[id number]]    [facility] [place] [place] [facility]  [place], [place] [address]  Phone:  [phone] Fax:  [phone]

## 2021-03-11 NOTE — Telephone Encounter (Signed)
Rx's sent. [facility] will place them on hold.

## 2021-10-29 ENCOUNTER — Telehealth: Payer: Self-pay | Admitting: Internal Medicine

## 2021-10-29 ENCOUNTER — Encounter: Payer: Self-pay | Admitting: Internal Medicine

## 2021-10-29 ENCOUNTER — Telehealth (INDEPENDENT_AMBULATORY_CARE_PROVIDER_SITE_OTHER): Payer: BC Managed Care – PPO | Admitting: Internal Medicine

## 2021-10-29 VITALS — BP 130/80 | HR 84 | Temp 98.8°F | Ht 72.0 in | Wt 245.0 lb

## 2021-10-29 DIAGNOSIS — U071 COVID-19: Secondary | ICD-10-CM

## 2021-10-29 MED ORDER — MOLNUPIRAVIR EUA 200MG CAPSULE
4.0000 | ORAL_CAPSULE | Freq: Two times a day (BID) | ORAL | 0 refills | Status: AC
Start: 2021-10-29 — End: 2021-11-03

## 2021-10-29 NOTE — Telephone Encounter (Signed)
pt had vv today with dr. Larose Kells regarding pos covid results. pt discovered a new symp after appt left eye purplish looking and would like to know if a picture can be sent in for a determination. please advise.

## 2021-10-29 NOTE — Telephone Encounter (Signed)
Please advise 

## 2021-10-29 NOTE — Progress Notes (Signed)
Subjective:    Patient ID: Joel Hutchinson, male    DOB: 08-Jan-1966, 56 y.o.   MRN: [id number]  DOS:  10/29/2021 Type of visit - description: Virtual Visit via Video Note  I connected with the above patient  by a video enabled telemedicine application and verified that I am speaking with the correct person using two identifiers.   THIS ENCOUNTER IS A VIRTUAL VISIT DUE TO COVID-19 - PATIENT WAS NOT SEEN IN THE OFFICE. PATIENT HAS CONSENTED TO VIRTUAL VISIT / TELEMEDICINE VISIT   Location of patient: home  Location of provider: office  Persons participating in the virtual visit: patient, provider   I discussed the limitations of evaluation and management by telemedicine and the availability of in person appointments. The patient expressed understanding and agreed to proceed.   Acute Symptoms a started 10/27/2021: Mild aches, subsequently developed sinus congestion and sinus burning. Last night, he woke up sweating and today he tested positive for COVID. Has not checked his temperature. Denies chest pain, shortness of breath. Some sneezing but no coughing. No nausea or vomiting Does not recall any sick contacts.  Review of Systems See above   Past Medical History:  Diagnosis Date   Anxiety    Arthralgia    Diverticulitis 06-2014   HTN (hypertension)    Prediabetes 09/15/2014    Past Surgical History:  Procedure Laterality Date   CHOLECYSTECTOMY N/A 04/09/2014   Procedure: LAPAROSCOPIC CHOLECYSTECTOMY;  Surgeon: Rolm Bookbinder, MD;  Location: [facility];  Service: General;  Laterality: N/A;   NASAL SINUS SURGERY  1988    Allergies as of 10/29/2021   No Known Allergies      Medication List        Accurate as of October 29, 2021  1:52 PM. If you have any questions, ask your nurse or doctor.          acetaminophen 500 MG tablet Commonly known as: TYLENOL Take 1,000 mg by mouth every 6 (six) hours as needed for mild pain.   aspirin EC 81 MG tablet Take 81 mg by  mouth daily. Swallow whole.   lisinopril 40 MG tablet Commonly known as: ZESTRIL Take 1 tablet (40 mg total) by mouth daily.   molnupiravir EUA 200 mg Caps capsule Commonly known as: LAGEVRIO Take 4 capsules (800 mg total) by mouth 2 (two) times daily for 5 days. Started by: Kathlene November, MD   PARoxetine 20 MG tablet Commonly known as: PAXIL Take 1 tablet (20 mg total) by mouth daily.           Objective:   Physical Exam BP 130/80    Pulse 93    Temp 98.8 F (37.1 C)    Ht 6' (1.829 m)    Wt 245 lb (111.1 kg)    BMI 33.23 kg/m  Patient is alert oriented x3, in no distress, speaking in complete sentences.  No cough, chest congestion noted.    Assessment     Assessment Prediabetes HTN DJD Anxiety, h/o panic attacks  h/o diverticulitis +FH father MI age 18  PLAN COVID-19 Symptoms a started 2 days ago, he is in no distress, O2 sat is a slightly low (94%) however he has no dyspnea, no chest pain or chest congestion. Pros of antivirals discussed.  We agreed on the following. Molnupiravir. Rest, fluids, Tylenol, OTCs as needed if cough. Isolate for at least 7 days. Proceed with a COVID-vaccine in 1 month Call if not gradually better or if he gets worse.  I discussed the assessment and treatment plan with the patient. The patient was provided an opportunity to ask questions and all were answered. The patient agreed with the plan and demonstrated an understanding of the instructions.   The patient was advised to call back or seek an in-person evaluation if the symptoms worsen or if the condition fails to improve as anticipated.

## 2021-10-29 NOTE — Telephone Encounter (Signed)
Okay to send pictures via MyChart, I may be able to determine what the problem is

## 2021-10-29 NOTE — Telephone Encounter (Signed)
Spoke w/ Pt- walked him how to send picture via Mychart.

## 2021-10-30 NOTE — Assessment & Plan Note (Signed)
COVID-19 Symptoms a started 2 days ago, he is in no distress, O2 sat is a slightly low (94%) however he has no dyspnea, no chest pain or chest congestion. Pros of antivirals discussed.  We agreed on the following. Molnupiravir. Rest, fluids, Tylenol, OTCs as needed if cough. Isolate for at least 7 days. Proceed with a COVID-vaccine in 1 month Call if not gradually better or if he gets worse.

## 2021-12-09 ENCOUNTER — Ambulatory Visit (INDEPENDENT_AMBULATORY_CARE_PROVIDER_SITE_OTHER): Payer: BC Managed Care – PPO | Admitting: Internal Medicine

## 2021-12-09 ENCOUNTER — Encounter: Payer: Self-pay | Admitting: Internal Medicine

## 2021-12-09 VITALS — BP 124/64 | HR 69 | Temp 98.4°F | Resp 16 | Ht 72.0 in | Wt 248.2 lb

## 2021-12-09 DIAGNOSIS — R739 Hyperglycemia, unspecified: Secondary | ICD-10-CM | POA: Diagnosis not present

## 2021-12-09 DIAGNOSIS — Z0000 Encounter for general adult medical examination without abnormal findings: Secondary | ICD-10-CM | POA: Diagnosis not present

## 2021-12-09 DIAGNOSIS — I10 Essential (primary) hypertension: Secondary | ICD-10-CM

## 2021-12-09 LAB — CBC WITH DIFFERENTIAL/PLATELET
Basophils Absolute: 0 10*3/uL (ref 0.0–0.1)
Basophils Relative: 0.8 % (ref 0.0–3.0)
Eosinophils Absolute: 0.1 10*3/uL (ref 0.0–0.7)
Eosinophils Relative: 1.3 % (ref 0.0–5.0)
HCT: 45.9 % (ref 39.0–52.0)
Hemoglobin: 15.4 g/dL (ref 13.0–17.0)
Lymphocytes Relative: 35.6 % (ref 12.0–46.0)
Lymphs Abs: 2 10*3/uL (ref 0.7–4.0)
MCHC: 33.5 g/dL (ref 30.0–36.0)
MCV: 90.1 fl (ref 78.0–100.0)
Monocytes Absolute: 0.4 10*3/uL (ref 0.1–1.0)
Monocytes Relative: 7.7 % (ref 3.0–12.0)
Neutro Abs: 3 10*3/uL (ref 1.4–7.7)
Neutrophils Relative %: 54.6 % (ref 43.0–77.0)
Platelets: 183 10*3/uL (ref 150.0–400.0)
RBC: 5.1 Mil/uL (ref 4.22–5.81)
RDW: 13.3 % (ref 11.5–15.5)
WBC: 5.5 10*3/uL (ref 4.0–10.5)

## 2021-12-09 LAB — HEMOGLOBIN A1C: Hgb A1c MFr Bld: 6.2 % (ref 4.6–6.5)

## 2021-12-09 LAB — PSA: PSA: 1.36 ng/mL (ref 0.10–4.00)

## 2021-12-09 NOTE — Progress Notes (Signed)
Subjective:    Patient ID: Joel Hutchinson, male    DOB: [birth year], 56 y.o.   MRN: [id number]  DOS:  12/09/2021 Type of visit - description: cpx  At the last visit, he had COVID, symptoms quickly resolved.  Currently feeling well  Review of Systems  A 14 point review of systems is negative    Past Medical History:  Diagnosis Date   Anxiety    Arthralgia    Diverticulitis 06-2014   HTN (hypertension)    Prediabetes 09/15/2014    Past Surgical History:  Procedure Laterality Date   CHOLECYSTECTOMY N/A 04/09/2014   Procedure: LAPAROSCOPIC CHOLECYSTECTOMY;  Surgeon: Rolm Bookbinder, MD;  Location: [facility];  Service: General;  Laterality: N/A;   NASAL SINUS SURGERY  1988   Social History   Socioeconomic History   Marital status: Married    Spouse name: Not on file   Number of children: 0   Years of education: Not on file   Highest education level: Not on file  Occupational History   Occupation: truck driver  Tobacco Use   Smoking status: Former   Smokeless tobacco: Never   Tobacco comments:    quit at age 89  Substance and Sexual Activity   Alcohol use: No    Alcohol/week: 0.0 standard drinks   Drug use: No   Sexual activity: Not on file  Other Topics Concern   Not on file  Social History Narrative   Household-- pt and wife   Social Determinants of Health   Financial Resource Strain: Not on file  Food Insecurity: Not on file  Transportation Needs: Not on file  Physical Activity: Not on file  Stress: Not on file  Social Connections: Not on file  Intimate Partner Violence: Not on file    Current Outpatient Medications  Medication Instructions   acetaminophen (TYLENOL) 1,000 mg, Every 6 hours PRN   aspirin EC 81 mg, Daily   lisinopril (ZESTRIL) 40 mg, Oral, Daily   PARoxetine (PAXIL) 20 mg, Oral, Daily       Objective:   Physical Exam BP 124/64 (BP Location: Left Arm, Patient Position: Sitting, Cuff Size: Normal)    Pulse 69    Temp 98.4 F (36.9  C) (Oral)    Resp 16    Ht 6' (1.829 m)    Wt 248 lb 4 oz (112.6 kg)    SpO2 98%    BMI 33.67 kg/m  General: Well developed, NAD, BMI noted Neck: No  thyromegaly  HEENT:  Normocephalic . Face symmetric, atraumatic Lungs:  CTA B Normal respiratory effort, no intercostal retractions, no accessory muscle use. Heart: RRR,  no murmur.  Abdomen:  Not distended, soft, non-tender. No rebound or rigidity.   Lower extremities: no pretibial edema bilaterally DRE: Normal sphincter tone, no stools, prostate normal Skin: Exposed areas without rash. Not pale. Not jaundice Neurologic:  alert & oriented X3.  Speech normal, gait appropriate for age and unassisted Strength symmetric and appropriate for age.  Psych: Cognition and judgment appear intact.  Cooperative with normal attention span and concentration.  Behavior appropriate. No anxious or depressed appearing.     Assessment    Assessment Prediabetes HTN DJD Anxiety, h/o panic attacks  h/o diverticulitis +FH father MI age 45  PLAN Here for CPX Prediabetes: Check A1c HTN, on lisinopril, whenever his BP is checked he gets normal readings Anxiety: On Paxil, symptoms controlled FH CAD: Current 10-year risk is 6.8%.  This was discussed with the patient.  A coronary calcium score is an option to objectively  say if he needs a statins.  Continue aspirin. RTC 1 year     This visit occurred during the SARS-CoV-2 public health emergency.  Safety protocols were in place, including screening questions prior to the visit, additional usage of staff PPE, and extensive cleaning of exam room while observing appropriate contact time as indicated for disinfecting solutions.

## 2021-12-09 NOTE — Assessment & Plan Note (Signed)
Here for CPX Prediabetes: Check A1c HTN, on lisinopril, whenever his BP is checked he gets normal readings Anxiety: On Paxil, symptoms controlled FH CAD: Current 10-year risk is 6.8%.  This was discussed with the patient.  A coronary calcium score is an option to objectively  say if he needs a statins.  Continue aspirin. RTC 1 year

## 2021-12-09 NOTE — Assessment & Plan Note (Signed)
-  Td 2015 - shingrix d/w pt  -COVID VAX : UTD - had a flu shot   -Colon cancer screening: cscope 10-2015, next 10 y per  per GI letter -Prostate cancer screening: DRE normal, no symptoms. Labs.  -Patient education, discussed diet, exercise. -Labs: CMP, FLP,CBC, A1c, PSA -ACP information provided

## 2021-12-09 NOTE — Patient Instructions (Addendum)
Check the  blood pressure monthly BP GOAL is between 110/65 and  135/85. If it is consistently higher or lower, let me know     GO TO THE LAB : Get the blood work     [place], [place] back for  a physical exam in 1 year    "Living will", "[place] of attorney": Advanced care planning  (If you already have a living will or healthcare power of attorney, please bring the copy to be scanned in your chart.)  Advance care planning is a process that supports adults in  understanding and sharing their preferences regarding future medical care.   The patient's preferences are recorded in documents called Advance Directives.    Advanced directives are completed (and can be modified at any time) while the patient is in full mental capacity.   The documentation should be available at all times to the patient, the family and the healthcare providers.  Bring in a copy to be scanned in your chart is an excellent idea and is recommended   This legal documents direct treatment decision making and/or appoint a surrogate to make the decision if the patient is not capable to do so.    Advance directives can be documented in many types of formats,  documents have names such as:  Lliving will  Durable power of attorney for healthcare (healthcare proxy or healthcare power of attorney)  Combined directives  Physician orders for life-sustaining treatment    More information at:  [email or web address]

## 2021-12-10 LAB — LIPID PANEL
Cholesterol: 180 mg/dL (ref 0–200)
HDL: 45.4 mg/dL (ref >39.00)
LDL Cholesterol: 114 mg/dL — ABNORMAL HIGH (ref 0–99)
NonHDL: 134.84
Total CHOL/HDL Ratio: 4
Triglycerides: 103 mg/dL (ref 0.0–149.0)
VLDL: 20.6 mg/dL (ref 0.0–40.0)

## 2021-12-10 LAB — COMPREHENSIVE METABOLIC PANEL
ALT: 12 U/L (ref 0–53)
AST: 15 U/L (ref 0–37)
Albumin: 4.3 g/dL (ref 3.5–5.2)
Alkaline Phosphatase: 44 U/L (ref 39–117)
BUN: 11 mg/dL (ref 6–23)
CO2: 29 mEq/L (ref 19–32)
Calcium: 9 mg/dL (ref 8.4–10.5)
Chloride: 106 mEq/L (ref 96–112)
Creatinine, Ser: 0.99 mg/dL (ref 0.40–1.50)
GFR: 85.25 mL/min (ref >60.00)
Glucose, Bld: 116 mg/dL — ABNORMAL HIGH (ref 70–99)
Potassium: 4.4 mEq/L (ref 3.5–5.1)
Sodium: 140 mEq/L (ref 135–145)
Total Bilirubin: 0.6 mg/dL (ref 0.2–1.2)
Total Protein: 7 g/dL (ref 6.0–8.3)

## 2022-02-04 ENCOUNTER — Emergency Department (INDEPENDENT_AMBULATORY_CARE_PROVIDER_SITE_OTHER)
Admission: EM | Admit: 2022-02-04 | Discharge: 2022-02-04 | Disposition: A | Discharge status: Home / Self Care | Payer: BC Managed Care – PPO | Source: Home / Self Care | Attending: Family Medicine | Admitting: Family Medicine

## 2022-02-04 ENCOUNTER — Encounter: Payer: Self-pay | Admitting: Emergency Medicine

## 2022-02-04 DIAGNOSIS — J069 Acute upper respiratory infection, unspecified: Secondary | ICD-10-CM | POA: Diagnosis not present

## 2022-02-04 DIAGNOSIS — J040 Acute laryngitis: Secondary | ICD-10-CM

## 2022-02-04 MED ORDER — PREDNISONE 20 MG PO TABS: 20.0000 mg | ORAL_TABLET | Freq: Two times a day (BID) | ORAL | 0 refills | Status: DC

## 2022-02-04 MED ORDER — AZITHROMYCIN 250 MG PO TABS: ORAL_TABLET | ORAL | 0 refills | Status: DC

## 2022-02-04 NOTE — Discharge Instructions (Addendum)
Make sure you are drinking lots of water ?Take the prednisone 2 times a day for 5 days ?In addition take Mucinex DM to loosen the mucus and make it easier to pass ?If you fail to see improvement over the next 2 to 3 days add azithromycin Z-Pak. ?Follow-up with your primary care doctor ?

## 2022-02-04 NOTE — ED Provider Notes (Signed)
?[facility] ? ? ? ?CSN: [id number] ?Arrival date & time: 02/04/22  1443 ? ? ?  ? ?History   ?Chief Complaint ?Chief Complaint  ?Patient presents with  ? Sore Throat  ? ? ?HPI ?Joel Hutchinson is a 56 y.o. male.  ? ?HPI ? ?Patient has been sick for 3 to 4 days.  Sore throat, hoarse voice, could hardly speak, scratchy throat, mostly on the left tonsil region.  No swollen nodes, mild runny nose and postnasal drip.  No coughing or chest congestion.  No fever or chills.  No headache or body ache.  He does have allergies.  An allergy pill does not seem to be helping this.  He feels like he might have a sinus infection. ? ?Past Medical History:  ?Diagnosis Date  ? Anxiety   ? Arthralgia   ? Diverticulitis 06-2014  ? HTN (hypertension)   ? Prediabetes 09/15/2014  ? ? ?Patient Active Problem List  ? Diagnosis Date Noted  ? PCP NOTES >>>> 09/17/2015  ? Annual physical exam 09/15/2014  ? pcp notes---Prediabetes 09/15/2014  ? LLQ abdominal pain 07/24/2014  ? RBBB 06/18/2014  ? *HTN     ? *Anxiety   ? DJD (degenerative joint disease)   ? ? ?Past Surgical History:  ?Procedure Laterality Date  ? CHOLECYSTECTOMY N/A 04/09/2014  ? Procedure: LAPAROSCOPIC CHOLECYSTECTOMY;  Surgeon: Rolm Bookbinder, MD;  Location: [facility];  Service: General;  Laterality: N/A;  ? NASAL SINUS SURGERY  1988  ? ? ? ? ? ?Home Medications   ? ?Prior to Admission medications   ?Medication Sig Start Date End Date Taking? Authorizing Provider  ?acetaminophen (TYLENOL) 500 MG tablet Take 1,000 mg by mouth every 6 (six) hours as needed for mild pain.   Yes [provider]  ?lisinopril (ZESTRIL) 40 MG tablet Take 1 tablet (40 mg total) by mouth daily. 03/11/21  Yes Paz, Alda Berthold, MD  ?PARoxetine (PAXIL) 20 MG tablet Take 1 tablet (20 mg total) by mouth daily. 03/11/21  Yes Colon Branch, MD  ?predniSONE (DELTASONE) 20 MG tablet Take 1 tablet (20 mg total) by mouth 2 (two) times daily with a meal. 02/04/22  Yes Raylene Everts, MD  ?azithromycin  (ZITHROMAX Z-PAK) 250 MG tablet Take two pills today followed by one a day until gone 02/04/22   Raylene Everts, MD  ? ? ?Family History ?Family History  ?Problem Relation Age of Onset  ? CAD Father   ?     MI age 6, died few weeks later   ? Diabetes Father   ?     father, bro, GP  ? Stroke Other   ?     GM  ? Atrial fibrillation Mother   ? Colon cancer Neg Hx   ? Prostate cancer Neg Hx   ? ? ?Social History ?Social History  ? ?Tobacco Use  ? Smoking status: Former  ? Smokeless tobacco: Never  ? Tobacco comments:  ?  quit at age 72  ?Substance Use Topics  ? Alcohol use: No  ?  Alcohol/week: 0.0 standard drinks  ? Drug use: No  ? ? ? ?Allergies   ?Patient has no known allergies. ? ? ?Review of Systems ?Review of Systems ?See HPI ? ?Physical Exam ?Triage Vital Signs ?ED Triage Vitals  ?Enc Vitals Group  ?   BP 02/04/22 1515 (!) 142/87  ?   Pulse Rate 02/04/22 1515 85  ?   Resp 02/04/22 1515 18  ?  Temp 02/04/22 1515 99.2 ?F (37.3 ?C)  ?   Temp Source 02/04/22 1515 Oral  ?   SpO2 02/04/22 1515 95 %  ?   Weight 02/04/22 1516 245 lb (111.1 kg)  ?   Height 02/04/22 1516 6' (1.829 m)  ?   Head Circumference --   ?   Peak Flow --   ?   Pain Score 02/04/22 1515 0  ?   Pain Loc --   ?   Pain Edu? --   ?   Excl. in [place]? --   ? ?No data found. ? ?Updated Vital Signs ?BP (!) 142/87 (BP Location: Right Arm)   Pulse 85   Temp 99.2 ?F (37.3 ?C) (Oral)   Resp 18   Ht 6' (1.829 m)   Wt 111.1 kg   SpO2 95%   BMI 33.23 kg/m?  ?   ? ?Physical Exam ?Constitutional:   ?   General: He is not in acute distress. ?   Appearance: He is well-developed.  ?   Comments: Overweight  ?HENT:  ?   Head: Normocephalic and atraumatic.  ?   Right Ear: Tympanic membrane and ear canal normal.  ?   Left Ear: Tympanic membrane and ear canal normal.  ?   Nose: Congestion and rhinorrhea present.  ?   Mouth/Throat:  ?   Pharynx: Uvula midline. Posterior oropharyngeal erythema present. No pharyngeal swelling.  ?   Tonsils: No tonsillar exudate. 1+ on  the right. 1+ on the left.  ?Eyes:  ?   Conjunctiva/sclera: Conjunctivae normal.  ?   Pupils: Pupils are equal, round, and reactive to light.  ?Cardiovascular:  ?   Rate and Rhythm: Normal rate and regular rhythm.  ?   Heart sounds: Normal heart sounds.  ?Pulmonary:  ?   Effort: Pulmonary effort is normal. No respiratory distress.  ?   Breath sounds: Normal breath sounds. No wheezing or rhonchi.  ?Abdominal:  ?   General: There is no distension.  ?   Palpations: Abdomen is soft.  ?Musculoskeletal:     ?   General: Normal range of motion.  ?   Cervical back: Normal range of motion.  ?Lymphadenopathy:  ?   Cervical: No cervical adenopathy.  ?Skin: ?   General: Skin is warm and dry.  ?Neurological:  ?   Mental Status: He is alert.  ?Psychiatric:     ?   Mood and Affect: Mood normal.     ?   Behavior: Behavior normal.  ? ? ? ?UC Treatments / Results  ?Labs ?(all labs ordered are listed, but only abnormal results are displayed) ?Labs Reviewed - No data to display ? ?EKG ? ? ?Radiology ?No results found. ? ?Procedures ?Procedures (including critical care time) ? ?Medications Ordered in UC ?Medications - No data to display ? ?Initial Impression / Assessment and Plan / UC Course  ?I have reviewed the triage vital signs and the nursing notes. ? ?Pertinent labs & imaging results that were available during my care of the patient were reviewed by me and considered in my medical decision making (see chart for details). ? ?  ? ?Discussed with this patient that laryngitis is almost always caused by a virus.  Recommend symptomatic care.  If patient fails to improve over the next few days he can fill and take the azithromycin.  He is advised to give this injection at least 7 days prior to taking antibiotics. ?Final Clinical Impressions(s) / UC Diagnoses  ? ?  Final diagnoses:  ?Laryngitis, acute  ?Upper respiratory tract infection, unspecified type  ? ? ? ?Discharge Instructions   ? ?  ?Make sure you are drinking lots of water ?Take  the prednisone 2 times a day for 5 days ?In addition take Mucinex DM to loosen the mucus and make it easier to pass ?If you fail to see improvement over the next 2 to 3 days add azithromycin Z-Pak. ?Follow-up with your primary care doctor ? ? ? ?ED Prescriptions   ? ? Medication Sig Dispense Auth. Provider  ? predniSONE (DELTASONE) 20 MG tablet Take 1 tablet (20 mg total) by mouth 2 (two) times daily with a meal. 10 tablet Raylene Everts, MD  ? azithromycin (ZITHROMAX Z-PAK) 250 MG tablet Take two pills today followed by one a day until gone 6 tablet Raylene Everts, MD  ? ?  ? ?PDMP not reviewed this encounter. ?  ?Raylene Everts, MD ?02/04/22 1603 ? ?

## 2022-02-04 NOTE — ED Triage Notes (Signed)
Patient c/o sore throat x 3 days, sinus drainage and hoarsness.  Patient has taken a home COVID test that was negative.  Patient denies any OTC meds. ?

## 2022-03-26 ENCOUNTER — Telehealth: Payer: Self-pay | Admitting: Internal Medicine

## 2022-03-26 MED ORDER — LISINOPRIL 40 MG PO TABS: 40.0000 mg | ORAL_TABLET | Freq: Every day | ORAL | 2 refills | Status: DC

## 2022-03-26 MED ORDER — PAROXETINE HCL 20 MG PO TABS: 20.0000 mg | ORAL_TABLET | Freq: Every day | ORAL | 2 refills | Status: DC

## 2022-03-26 NOTE — Telephone Encounter (Signed)
Medication:   PARoxetine (PAXIL) 20 MG tablet [[id number]]   lisinopril (ZESTRIL) 40 MG tablet [[id number]]   Has the patient contacted their pharmacy? No. (If no, request that the patient contact the pharmacy for the refill.) (If yes, when and what did the pharmacy advise?)  Preferred Pharmacy (with phone number or street name):   [facility], [place] [facility] [address]  [address], [place] [address]  Phone:  [phone]  Fax:  [phone]   Agent: Please be advised that RX refills may take up to 3 business days. We ask that you follow-up with your pharmacy.

## 2022-03-26 NOTE — Telephone Encounter (Signed)
Rxs sent

## 2022-04-30 DIAGNOSIS — H18831 Recurrent erosion of cornea, right eye: Secondary | ICD-10-CM | POA: Diagnosis not present

## 2022-04-30 DIAGNOSIS — H35371 Puckering of macula, right eye: Secondary | ICD-10-CM | POA: Diagnosis not present

## 2022-12-15 ENCOUNTER — Encounter: Payer: Self-pay | Admitting: Internal Medicine

## 2022-12-15 ENCOUNTER — Ambulatory Visit (INDEPENDENT_AMBULATORY_CARE_PROVIDER_SITE_OTHER): Payer: BC Managed Care – PPO | Admitting: Internal Medicine

## 2022-12-15 VITALS — BP 124/68 | HR 68 | Temp 98.6°F | Resp 18 | Ht 72.0 in | Wt 246.1 lb

## 2022-12-15 DIAGNOSIS — I10 Essential (primary) hypertension: Secondary | ICD-10-CM | POA: Diagnosis not present

## 2022-12-15 DIAGNOSIS — Z125 Encounter for screening for malignant neoplasm of prostate: Secondary | ICD-10-CM | POA: Diagnosis not present

## 2022-12-15 DIAGNOSIS — Z8249 Family history of ischemic heart disease and other diseases of the circulatory system: Secondary | ICD-10-CM | POA: Diagnosis not present

## 2022-12-15 DIAGNOSIS — Z0000 Encounter for general adult medical examination without abnormal findings: Secondary | ICD-10-CM | POA: Diagnosis not present

## 2022-12-15 DIAGNOSIS — R739 Hyperglycemia, unspecified: Secondary | ICD-10-CM | POA: Diagnosis not present

## 2022-12-15 LAB — CBC WITH DIFFERENTIAL/PLATELET
Basophils Absolute: 0.1 10*3/uL (ref 0.0–0.1)
Basophils Relative: 0.9 % (ref 0.0–3.0)
Eosinophils Absolute: 0.1 10*3/uL (ref 0.0–0.7)
Eosinophils Relative: 0.9 % (ref 0.0–5.0)
HCT: 47.5 % (ref 39.0–52.0)
Hemoglobin: 16.3 g/dL (ref 13.0–17.0)
Lymphocytes Relative: 40.2 % (ref 12.0–46.0)
Lymphs Abs: 2.5 10*3/uL (ref 0.7–4.0)
MCHC: 34.3 g/dL (ref 30.0–36.0)
MCV: 89.6 fl (ref 78.0–100.0)
Monocytes Absolute: 0.5 10*3/uL (ref 0.1–1.0)
Monocytes Relative: 8 % (ref 3.0–12.0)
Neutro Abs: 3.1 10*3/uL (ref 1.4–7.7)
Neutrophils Relative %: 50 % (ref 43.0–77.0)
Platelets: 205 10*3/uL (ref 150.0–400.0)
RBC: 5.29 Mil/uL (ref 4.22–5.81)
RDW: 12.5 % (ref 11.5–15.5)
WBC: 6.3 10*3/uL (ref 4.0–10.5)

## 2022-12-15 LAB — LIPID PANEL
Cholesterol: 178 mg/dL (ref 0–200)
HDL: 44.5 mg/dL (ref >39.00)
LDL Cholesterol: 115 mg/dL — ABNORMAL HIGH (ref 0–99)
NonHDL: 133.02
Total CHOL/HDL Ratio: 4
Triglycerides: 92 mg/dL (ref 0.0–149.0)
VLDL: 18.4 mg/dL (ref 0.0–40.0)

## 2022-12-15 LAB — COMPREHENSIVE METABOLIC PANEL
ALT: 13 U/L (ref 0–53)
AST: 14 U/L (ref 0–37)
Albumin: 4.3 g/dL (ref 3.5–5.2)
Alkaline Phosphatase: 46 U/L (ref 39–117)
BUN: 13 mg/dL (ref 6–23)
CO2: 28 mEq/L (ref 19–32)
Calcium: 9.2 mg/dL (ref 8.4–10.5)
Chloride: 103 mEq/L (ref 96–112)
Creatinine, Ser: 0.99 mg/dL (ref 0.40–1.50)
GFR: 84.65 mL/min (ref >60.00)
Glucose, Bld: 113 mg/dL — ABNORMAL HIGH (ref 70–99)
Potassium: 4.3 mEq/L (ref 3.5–5.1)
Sodium: 138 mEq/L (ref 135–145)
Total Bilirubin: 0.7 mg/dL (ref 0.2–1.2)
Total Protein: 7.1 g/dL (ref 6.0–8.3)

## 2022-12-15 LAB — HEMOGLOBIN A1C: Hgb A1c MFr Bld: 6.3 % (ref 4.6–6.5)

## 2022-12-15 LAB — PSA: PSA: 1.12 ng/mL (ref 0.10–4.00)

## 2022-12-15 LAB — TSH: TSH: 2.14 u[IU]/mL (ref 0.35–5.50)

## 2022-12-15 NOTE — Patient Instructions (Addendum)
Vaccines I recommend:  Shingrix (shingles)   Check the  blood pressure regularly BP GOAL is between 110/65 and  135/85. If it is consistently higher or lower, let me know  Will schedule calcium coronary score     GO TO THE LAB : Get the blood work     [place], [place] back for   a physical exam in 1 year depending on results    "[place] of attorney" ,  "Living will" (Advance care planning documents)  If you already have a living will or healthcare power of attorney, is recommended you bring the copy to be scanned in your chart.   The document will be available to all the doctors you see in the system.  Advance care planning is a process that supports adults in  understanding and sharing their preferences regarding future medical care.  The patient's preferences are recorded in documents called Advance Directives and the can be modified at any time while the patient is in full mental capacity.   If you don't have one, please consider create one.      More information at: [email or web address]

## 2022-12-15 NOTE — Progress Notes (Addendum)
 Subjective:    Patient ID: Joel Hutchinson, male    DOB: 04/19/1966, 57 y.o.   MRN: [id number]  DOS:  12/15/2022 Type of visit - description: cpx  Here for CPX In general feels well. Ambulatory BP normal  Review of Systems   A 14 point review of systems is negative    Past Medical History:  Diagnosis Date   Anxiety    Arthralgia    Diverticulitis 06-2014   HTN (hypertension)    Prediabetes 09/15/2014    Past Surgical History:  Procedure Laterality Date   CHOLECYSTECTOMY N/A 04/09/2014   Procedure: LAPAROSCOPIC CHOLECYSTECTOMY;  Surgeon: Emelia Loron, MD;  Location: [facility];  Service: General;  Laterality: N/A;   NASAL SINUS SURGERY  1988   Social History   Socioeconomic History   Marital status: Married    Spouse name: Not on file   Number of children: 0   Years of education: Not on file   Highest education level: Not on file  Occupational History   Occupation: truck driver  Tobacco Use   Smoking status: Former   Smokeless tobacco: Never   Tobacco comments:    quit at age 5  Substance and Sexual Activity   Alcohol use: No    Alcohol/week: 0.0 standard drinks of alcohol   Drug use: No   Sexual activity: Not on file  Other Topics Concern   Not on file  Social History Narrative   Household-- pt and wife   Social Determinants of Health   Financial Resource Strain: Not on file  Food Insecurity: Not on file  Transportation Needs: Not on file  Physical Activity: Not on file  Stress: Not on file  Social Connections: Not on file  Intimate Partner Violence: Not on file    Current Outpatient Medications  Medication Instructions   acetaminophen (TYLENOL) 1,000 mg, Every 6 hours PRN   lisinopril (ZESTRIL) 40 mg, Oral, Daily   PARoxetine (PAXIL) 20 mg, Oral, Daily       Objective:   Physical Exam BP 124/68   Pulse 68   Temp 98.6 F (37 C) (Oral)   Resp 18   Ht 6' (1.829 m)   Wt 246 lb 2 oz (111.6 kg)   SpO2 97%   BMI 33.38 kg/m   General: Well developed, NAD, BMI noted Neck: No  thyromegaly  HEENT:  Normocephalic . Face symmetric, atraumatic Lungs:  CTA B Normal respiratory effort, no intercostal retractions, no accessory muscle use. Heart: RRR,  no murmur.  Abdomen:  Not distended, soft, non-tender. No rebound or rigidity.   Lower extremities: no pretibial edema bilaterally  Skin: Exposed areas without rash. Not pale. Not jaundice Neurologic:  alert & oriented X3.  Speech normal, gait appropriate for age and unassisted Strength symmetric and appropriate for age.  Psych: Cognition and judgment appear intact.  Cooperative with normal attention span and concentration.  Behavior appropriate. No anxious or depressed appearing.     Assessment     Assessment Prediabetes HTN DJD Anxiety, h/o panic attacks  h/o diverticulitis +FH father MI age 53  PLAN Here for CPX Prediabetes: Check A1c HTN: Checking labs, ambulatory BPs reportedly normal. EKG: NSR, no change from previous.  Plan: Continue lisinopril DJD: Has chronic back pain for 30 years, occasional right arm pain. Anxiety: Controlled on Paxil FH CAD, father MI at age 40, current CV risk 7.2%, patient interested in a coronary calcium score, will arrange.ASA not on med list, rec to restart RTC 1 year  depending on results.

## 2022-12-16 ENCOUNTER — Encounter: Payer: Self-pay | Admitting: Internal Medicine

## 2022-12-16 MED ORDER — ASPIRIN 81 MG PO TBEC: 81.0000 mg | DELAYED_RELEASE_TABLET | Freq: Every day | ORAL | Status: AC

## 2022-12-16 NOTE — Assessment & Plan Note (Signed)
Here for CPX Prediabetes: Check A1c HTN: Checking labs, ambulatory BPs reportedly normal. EKG: NSR, no change from previous.  Plan: Continue lisinopril DJD: Has chronic back pain for 30 years, occasional right arm pain. Anxiety: Controlled on Paxil FH CAD, father MI at age 58, current CV risk 7.2%, patient interested in a coronary calcium score, will arrange.ASA not on med list, rec to restart RTC 1 year depending on results.

## 2022-12-16 NOTE — Assessment & Plan Note (Signed)
-  Td 2015 - shingrix d/w pt  -COVID VAX : UTD - had a flu shot   -Colon cancer screening: cscope 10-2015, next 10 y per  per GI letter -Prostate cancer screening: no symptoms. Labs.  - Diet exercise: Discussed -Labs: CMP FLP CBC A1c TSH PSA EKG -ACP information provided

## 2022-12-24 ENCOUNTER — Ambulatory Visit (INDEPENDENT_AMBULATORY_CARE_PROVIDER_SITE_OTHER): Payer: Self-pay

## 2022-12-24 DIAGNOSIS — Z8249 Family history of ischemic heart disease and other diseases of the circulatory system: Secondary | ICD-10-CM

## 2023-01-28 ENCOUNTER — Other Ambulatory Visit: Payer: Self-pay | Admitting: Internal Medicine

## 2023-12-21 ENCOUNTER — Encounter: Payer: BC Managed Care – PPO | Admitting: Internal Medicine

## 2024-01-15 ENCOUNTER — Encounter: Payer: Self-pay | Admitting: Internal Medicine

## 2024-01-15 ENCOUNTER — Ambulatory Visit (INDEPENDENT_AMBULATORY_CARE_PROVIDER_SITE_OTHER): Payer: BC Managed Care – PPO | Admitting: Internal Medicine

## 2024-01-15 VITALS — BP 126/72 | HR 55 | Temp 97.9°F | Resp 18 | Ht 72.0 in | Wt 230.4 lb

## 2024-01-15 DIAGNOSIS — R739 Hyperglycemia, unspecified: Secondary | ICD-10-CM

## 2024-01-15 DIAGNOSIS — Z0001 Encounter for general adult medical examination with abnormal findings: Secondary | ICD-10-CM

## 2024-01-15 DIAGNOSIS — I10 Essential (primary) hypertension: Secondary | ICD-10-CM

## 2024-01-15 DIAGNOSIS — Z0000 Encounter for general adult medical examination without abnormal findings: Secondary | ICD-10-CM

## 2024-01-15 DIAGNOSIS — Z23 Encounter for immunization: Secondary | ICD-10-CM | POA: Diagnosis not present

## 2024-01-15 DIAGNOSIS — Z09 Encounter for follow-up examination after completed treatment for conditions other than malignant neoplasm: Secondary | ICD-10-CM

## 2024-01-15 DIAGNOSIS — F419 Anxiety disorder, unspecified: Secondary | ICD-10-CM

## 2024-01-15 DIAGNOSIS — R972 Elevated prostate specific antigen [PSA]: Secondary | ICD-10-CM

## 2024-01-15 NOTE — Patient Instructions (Addendum)
 Restart aspirin 81 mg 1 tablet daily.  Vaccines I recommend: Covid booster Shingrix (shingles) Flu shot   Check the  blood pressure regularly Blood pressure goal:  between 110/65 and  135/85. If it is consistently higher or lower, let me know In 2 months, see our nurse let her check your blood pressure.  Bring your own blood pressure cuff     GO TO THE LAB : Get the blood work     Please go to the front desk:  Arrange for a nurse visit in 2 month   Arrange for a physical exam in 1 year.      "Health Care Power of attorney" (Also know as a  "Living will" or  Advance care planning documents)  If you already have a living will or healthcare power of attorney, is recommended you bring the copy to be scanned in your chart.   The document will be available to all the doctors you see in the system.  If you are over 59 y/o and don't have the document, please read:  Advance care planning is a process that supports adults in  understanding and sharing their preferences regarding future medical care.  The patient's preferences are recorded in documents called Advance Directives and the can be modified at any time while the patient is in full mental capacity.     More information at: [email or web address]

## 2024-01-15 NOTE — Progress Notes (Signed)
 Subjective:    Patient ID: Joel Hutchinson, male    DOB: 1966/03/26, 58 y.o.   MRN: [id number]  DOS:  01/15/2024 Type of visit - description: CPX Here for CPX. Feeling very well. Has no concerns  Wt Readings from Last 3 Encounters:  01/15/24 230 lb 6 oz (104.5 kg)  12/15/22 246 lb 2 oz (111.6 kg)  02/04/22 245 lb (111.1 kg)   Review of Systems  A 14 point review of systems is negative    Past Medical History:  Diagnosis Date   Anxiety    Arthralgia    Diverticulitis 06-2014   HTN (hypertension)    Prediabetes 09/15/2014    Past Surgical History:  Procedure Laterality Date   CHOLECYSTECTOMY N/A 04/09/2014   Procedure: LAPAROSCOPIC CHOLECYSTECTOMY;  Surgeon: Emelia Loron, MD;  Location: [facility] OR;  Service: General;  Laterality: N/A;   NASAL SINUS SURGERY  1988   Social History   Socioeconomic History   Marital status: Married    Spouse name: Not on file   Number of children: 0   Years of education: Not on file   Highest education level: 12th grade  Occupational History   Occupation: truck driver  Tobacco Use   Smoking status: Former   Smokeless tobacco: Never   Tobacco comments:    quit at age 53  Substance and Sexual Activity   Alcohol use: No    Alcohol/week: 0.0 standard drinks of alcohol   Drug use: No   Sexual activity: Not on file  Other Topics Concern   Not on file  Social History Narrative   Household-- pt and wife   Social Drivers of Corporate investment banker Strain: Low Risk  (01/14/2024)   Overall Financial Resource Strain (CARDIA)    Difficulty of Paying Living Expenses: Not hard at all  Food Insecurity: No Food Insecurity (01/14/2024)   Hunger Vital Sign    Worried About Running Out of Food in the Last Year: Never true    Ran Out of Food in the Last Year: Never true  Transportation Needs: No Transportation Needs (01/14/2024)   PRAPARE - Administrator, Civil Service (Medical): No    Lack of Transportation (Non-Medical):  No  Physical Activity: Sufficiently Active (01/14/2024)   Exercise Vital Sign    Days of Exercise per Week: 6 days    Minutes of Exercise per Session: 30 min  Stress: No Stress Concern Present (01/14/2024)   [employer] of Occupational Health - Occupational Stress Questionnaire    Feeling of Stress : Not at all  Social Connections: Moderately Isolated (01/14/2024)   Social Connection and Isolation Panel [NHANES]    Frequency of Communication with Friends and Family: More than three times a week    Frequency of Social Gatherings with Friends and Family: Once a week    Attends Religious Services: Never    Database administrator or Organizations: No    Attends Engineer, structural: Not on file    Marital Status: Married  Intimate Partner Violence: Unknown (01/30/2022)   Received from [employer], [facility]   HITS    Physically Hurt: Not on file    Insult or Talk Down To: Not on file    Threaten Physical Harm: Not on file    Scream or Curse: Not on file    Current Outpatient Medications  Medication Instructions   acetaminophen (TYLENOL) 1,000 mg, Every 6 hours PRN   aspirin EC 81 mg, Oral,  Daily, Swallow whole.   lisinopril (ZESTRIL) 40 mg, Oral, Daily   PARoxetine (PAXIL) 20 mg, Oral, Daily       Objective:   Physical Exam BP 126/72   Pulse (!) 55   Temp 97.9 F (36.6 C) (Oral)   Resp 18   Ht 6' (1.829 m)   Wt 230 lb 6 oz (104.5 kg)   SpO2 94%   BMI 31.24 kg/m  General: Well developed, NAD, BMI noted Neck: No  thyromegaly  HEENT:  Normocephalic . Face symmetric, atraumatic Lungs:  CTA B Normal respiratory effort, no intercostal retractions, no accessory muscle use. Heart: RRR,  no murmur.  Abdomen:  Not distended, soft, non-tender. No rebound or rigidity.   Lower extremities: no pretibial edema bilaterally  Skin: Exposed areas without rash. Not pale. Not jaundice Neurologic:  alert & oriented X3.  Speech normal, gait appropriate for age and  unassisted Strength symmetric and appropriate for age.  Psych: Cognition and judgment appear intact.  Cooperative with normal attention span and concentration.  Behavior appropriate. No anxious or depressed appearing.     Assessment    Problem list Prediabetes HTN DJD Anxiety, h/o panic attacks  h/o diverticulitis CV: +FH father MI age 59 Coronary calcium score 11/2022: 0, Rx aspirin  PLAN Here for CPX Td 12/2023  Vaccines I recommend: Flu shot every fall, COVID-vaccine, Shingrix -Colon cancer screening: cscope 10-2015, next 10 y per  per GI letter -Prostate cancer screening: no symptoms.  Check a PSA. - Diet: Has improved in the last few months, + weight loss noted ,praised; he remains active at work -Labs: CMP FLP CBC A1c PSA -ACP information provided Other issues addressed:  Prediabetes: Check A1c HTN: On lisinopril, checking labs, BP today and at the dentist 126/83, 126/72.  At home they are typically slightly higher.  (150/96) plan: No change, nurse visiting 2 months, patient to bring his cuff FH CAD: Coronary calcium score last year was negative, has not been taking aspirin, recommend to do so.  No need for statins for now. Anxiety: On Paxil.  RF sent  RTC nurse visit for BP check 2 months RTC CPX 1 year

## 2024-01-16 ENCOUNTER — Encounter: Payer: Self-pay | Admitting: Internal Medicine

## 2024-01-16 LAB — COMPREHENSIVE METABOLIC PANEL
AG Ratio: 1.6 (calc) (ref 1.0–2.5)
ALT: 12 U/L (ref 9–46)
AST: 16 U/L (ref 10–35)
Albumin: 4.5 g/dL (ref 3.6–5.1)
Alkaline phosphatase (APISO): 52 U/L (ref 35–144)
BUN: 15 mg/dL (ref 7–25)
CO2: 27 mmol/L (ref 20–32)
Calcium: 9.5 mg/dL (ref 8.6–10.3)
Chloride: 104 mmol/L (ref 98–110)
Creat: 0.98 mg/dL (ref 0.70–1.30)
Globulin: 2.9 g/dL (ref 1.9–3.7)
Glucose, Bld: 94 mg/dL (ref 65–99)
Potassium: 4.4 mmol/L (ref 3.5–5.3)
Sodium: 141 mmol/L (ref 135–146)
Total Bilirubin: 1 mg/dL (ref 0.2–1.2)
Total Protein: 7.4 g/dL (ref 6.1–8.1)
eGFR: 89 mL/min/{1.73_m2} (ref >=60)

## 2024-01-16 LAB — CBC WITH DIFFERENTIAL/PLATELET
Absolute Lymphocytes: 2534 {cells}/uL (ref 850–3900)
Absolute Monocytes: 588 {cells}/uL (ref 200–950)
Basophils Absolute: 42 {cells}/uL (ref 0–200)
Basophils Relative: 0.6 %
Eosinophils Absolute: 70 {cells}/uL (ref 15–500)
Eosinophils Relative: 1 %
HCT: 45.3 % (ref 38.5–50.0)
Hemoglobin: 15.9 g/dL (ref 13.2–17.1)
MCH: 30.8 pg (ref 27.0–33.0)
MCHC: 35.1 g/dL (ref 32.0–36.0)
MCV: 87.6 fL (ref 80.0–100.0)
MPV: 10.5 fL (ref 7.5–12.5)
Monocytes Relative: 8.4 %
Neutro Abs: 3766 {cells}/uL (ref 1500–7800)
Neutrophils Relative %: 53.8 %
Platelets: 223 10*3/uL (ref 140–400)
RBC: 5.17 10*6/uL (ref 4.20–5.80)
RDW: 12.8 % (ref 11.0–15.0)
Total Lymphocyte: 36.2 %
WBC: 7 10*3/uL (ref 3.8–10.8)

## 2024-01-16 LAB — PSA: PSA: 2.71 ng/mL (ref <=4.00)

## 2024-01-16 LAB — LIPID PANEL
Cholesterol: 180 mg/dL (ref <200)
HDL: 43 mg/dL (ref >=40)
LDL Cholesterol (Calc): 119 mg/dL — ABNORMAL HIGH
Non-HDL Cholesterol (Calc): 137 mg/dL — ABNORMAL HIGH (ref <130)
Total CHOL/HDL Ratio: 4.2 (calc) (ref <5.0)
Triglycerides: 81 mg/dL (ref <150)

## 2024-01-16 LAB — HEMOGLOBIN A1C
Hgb A1c MFr Bld: 6.2 %{Hb} — ABNORMAL HIGH (ref <5.7)
Mean Plasma Glucose: 131 mg/dL
eAG (mmol/L): 7.3 mmol/L

## 2024-01-16 MED ORDER — PAROXETINE HCL 20 MG PO TABS: 20.0000 mg | ORAL_TABLET | Freq: Every day | ORAL | 3 refills | Status: AC

## 2024-01-16 NOTE — Assessment & Plan Note (Addendum)
 Here for CPX  Other issues addressed:  Prediabetes: Check A1c HTN: On lisinopril, checking labs, BP today and at the dentist 126/83, 126/72.  At home they are typically slightly higher.  (150/96) plan: No change, nurse visiting 2 months, patient to bring his cuff FH CAD: Coronary calcium score last year was negative, has not been taking aspirin, recommend to do so.  No need for statins for now. Anxiety: On Paxil.  RF sent  RTC nurse visit for BP check 2 months RTC CPX 1 year

## 2024-01-16 NOTE — Assessment & Plan Note (Addendum)
 Here for CPX Td 12/2023  Vaccines I recommend: Flu shot every fall, COVID-vaccine, Shingrix -Colon cancer screening: cscope 10-2015, next 10 y per  per GI letter -Prostate cancer screening: no symptoms.  Check a PSA. - Diet: Has improved in the last few months, + weight loss noted ,praised; he remains active at work -Labs: CMP FLP CBC A1c PSA -ACP information provided

## 2024-01-18 NOTE — Addendum Note (Signed)
 Addended byConrad Wykoff D on: 01/18/2024 07:55 AM   Modules accepted: Orders

## 2024-02-15 ENCOUNTER — Other Ambulatory Visit: Payer: Self-pay | Admitting: Internal Medicine

## 2024-02-15 NOTE — Telephone Encounter (Signed)
 Copied from CRM [id number]. Topic: Clinical - Medication Refill >> Feb 15, 2024  1:17 PM Rosaria Common wrote: Most Recent Primary Care Visit:  Provider: Devonna Foley E  Department: [facility]  Visit Type: PHYSICAL  Date: 01/15/2024  Medication: lisinopril  (ZESTRIL ) 40 MG tablet  Has the patient contacted their pharmacy? Yes (Agent: If no, request that the patient contact the pharmacy for the refill. If patient does not wish to contact the pharmacy document the reason why and proceed with request.) (Agent: If yes, when and what did the pharmacy advise?)  Is this the correct pharmacy for this prescription? Yes If no, delete pharmacy and type the correct one.  This is the patient's preferred pharmacy:    [facility] - [place], [place] - [address] [facility] AT Portal to Registered Caremark Sites [address] [place] [address] Phone: [phone] Fax: [phone]   Has the prescription been filled recently? Yes  Is the patient out of the medication? No  Has the patient been seen for an appointment in the last year OR does the patient have an upcoming appointment? Yes  Can we respond through MyChart? Yes  Agent: Please be advised that Rx refills may take up to 3 business days. We ask that you follow-up with your pharmacy.

## 2024-02-16 MED ORDER — LISINOPRIL 40 MG PO TABS: 40.0000 mg | ORAL_TABLET | Freq: Every day | ORAL | 1 refills | Status: DC

## 2024-02-16 NOTE — Telephone Encounter (Signed)
 Last Fill: 01/28/23    Routing to provider for review/authorization.

## 2024-03-09 ENCOUNTER — Ambulatory Visit (INDEPENDENT_AMBULATORY_CARE_PROVIDER_SITE_OTHER)

## 2024-03-09 ENCOUNTER — Other Ambulatory Visit (INDEPENDENT_AMBULATORY_CARE_PROVIDER_SITE_OTHER)

## 2024-03-09 DIAGNOSIS — R972 Elevated prostate specific antigen [PSA]: Secondary | ICD-10-CM | POA: Diagnosis not present

## 2024-03-09 DIAGNOSIS — I10 Essential (primary) hypertension: Secondary | ICD-10-CM | POA: Diagnosis not present

## 2024-03-09 LAB — PSA: PSA: 1.26 ng/mL (ref 0.10–4.00)

## 2024-03-09 NOTE — Progress Notes (Signed)
 Pt here for Blood pressure check per Dr.Paz  Pt currently takes: Lisinopril  40 mg   Pt reports compliance with medication.  BP today @ =122/72(r) 134/80(L) 155/95( personal machine) left arm  HR=63   Pt advised per PCP that readings were good today and to get a new blood pressure machine, pt voiced understanding.

## 2024-03-10 ENCOUNTER — Ambulatory Visit: Payer: Self-pay | Admitting: Internal Medicine

## 2024-03-15 ENCOUNTER — Ambulatory Visit

## 2024-08-01 ENCOUNTER — Telehealth: Payer: Self-pay | Admitting: Internal Medicine

## 2024-08-01 NOTE — Telephone Encounter (Unsigned)
 Copied from CRM [id number]. Topic: Clinical - Medication Refill >> Aug 01, 2024  3:04 PM Alfonso HERO wrote: Medication: lisinopril  (ZESTRIL ) 40 MG tablet  Has the patient contacted their pharmacy? No (Agent: If no, request that the patient contact the pharmacy for the refill. If patient does not wish to contact the pharmacy document the reason why and proceed with request.) (Agent: If yes, when and what did the pharmacy advise?)  This is the patient's preferred pharmacy:   [facility] - [place], [place] - [address] [facility] AT Portal to Registered Caremark Sites [address] [place] [address] Phone: [phone] Fax: [phone]  Is this the correct pharmacy for this prescription? Yes If no, delete pharmacy and type the correct one.   Has the prescription been filled recently? Yes  Is the patient out of the medication? No  Has the patient been seen for an appointment in the last year OR does the patient have an upcoming appointment? Yes  Can we respond through MyChart? Yes  Agent: Please be advised that Rx refills may take up to 3 business days. We ask that you follow-up with your pharmacy.  Patient said he usually get the Rx for a year but this time its only been for a few months and he is about to run out and needs it sent in to [facility].

## 2024-08-09 ENCOUNTER — Other Ambulatory Visit: Payer: Self-pay | Admitting: Internal Medicine

## 2024-08-09 MED ORDER — LISINOPRIL 40 MG PO TABS: 40.0000 mg | ORAL_TABLET | Freq: Every day | ORAL | 1 refills | Status: DC

## 2024-08-09 NOTE — Telephone Encounter (Signed)
 Rx sent.

## 2024-08-09 NOTE — Telephone Encounter (Signed)
 Copied from CRM [id number]. Topic: Clinical - Medication Refill >> Aug 01, 2024  3:04 PM Alfonso HERO wrote: Medication: lisinopril  (ZESTRIL ) 40 MG tablet  Has the patient contacted their pharmacy? No (Agent: If no, request that the patient contact the pharmacy for the refill. If patient does not wish to contact the pharmacy document the reason why and proceed with request.) (Agent: If yes, when and what did the pharmacy advise?)  This is the patient's preferred pharmacy:   [facility] - [place], [place] - One [facility] AT Portal to Registered Caremark Sites [address] [place] [address] Phone: [phone] Fax: [phone]  Is this the correct pharmacy for this prescription? Yes If no, delete pharmacy and type the correct one.   Has the prescription been filled recently? Yes  Is the patient out of the medication? No  Has the patient been seen for an appointment in the last year OR does the patient have an upcoming appointment? Yes  Can we respond through MyChart? Yes  Agent: Please be advised that Rx refills may take up to 3 business days. We ask that you follow-up with your pharmacy.  Patient said he usually get the Rx for a year but this time its only been for a few months and he is about to run out and needs it sent in to [facility]. >> Aug 09, 2024  2:18 PM Rea ORN wrote: This rx request was never routed to nurses on 08/01/24

## 2024-08-12 ENCOUNTER — Other Ambulatory Visit: Payer: Self-pay | Admitting: Internal Medicine

## 2025-01-17 ENCOUNTER — Encounter: Admitting: Internal Medicine
